# Patient Record
Sex: Male | Born: 1990
Health system: Southern US, Community
[De-identification: ages and names within clinical notes are randomized; demographics above are authoritative.]

## PROBLEM LIST (undated history)

## (undated) DIAGNOSIS — J45909 Unspecified asthma, uncomplicated: Secondary | ICD-10-CM

---
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
## 2008-02-24 ENCOUNTER — Emergency Department (HOSPITAL_COMMUNITY): Admission: EM | Admit: 2008-02-24 | Discharge: 2008-02-24 | Payer: Self-pay | Admitting: *Deleted

## 2009-08-14 ENCOUNTER — Emergency Department (HOSPITAL_COMMUNITY): Admission: EM | Admit: 2009-08-14 | Discharge: 2009-08-14 | Payer: Self-pay | Admitting: Emergency Medicine

## 2010-03-16 ENCOUNTER — Emergency Department (HOSPITAL_COMMUNITY)
Admission: EM | Admit: 2010-03-16 | Discharge: 2010-03-16 | Payer: Self-pay | Source: Home / Self Care | Admitting: Emergency Medicine

## 2010-08-02 LAB — POCT I-STAT, CHEM 8
BUN: 12 mg/dL (ref 6–23)
Creatinine, Ser: 0.9 mg/dL (ref 0.4–1.5)
Hemoglobin: 16.3 g/dL (ref 13.0–17.0)
Potassium: 3.5 mEq/L (ref 3.5–5.1)

## 2010-08-02 LAB — DIFFERENTIAL
Basophils Absolute: 0 10*3/uL (ref 0.0–0.1)
Lymphs Abs: 2.2 10*3/uL (ref 0.7–4.0)
Monocytes Relative: 6 % (ref 3–12)
Neutro Abs: 4.1 10*3/uL (ref 1.7–7.7)

## 2010-08-02 LAB — CBC
HCT: 45 % (ref 39.0–52.0)
MCV: 90.2 fL (ref 78.0–100.0)
RBC: 4.99 MIL/uL (ref 4.22–5.81)
RDW: 14 % (ref 11.5–15.5)
WBC: 7.1 10*3/uL (ref 4.0–10.5)

## 2010-08-02 LAB — POCT CARDIAC MARKERS
CKMB, poc: <1 ng/mL — ABNORMAL LOW (ref 1.0–8.0)
Troponin i, poc: <0.05 ng/mL (ref 0.00–0.09)

## 2015-12-04 ENCOUNTER — Ambulatory Visit (HOSPITAL_COMMUNITY)
Admission: EM | Admit: 2015-12-04 | Discharge: 2015-12-04 | Disposition: A | Discharge status: Home / Self Care | Payer: Self-pay | Attending: Family Medicine | Admitting: Family Medicine

## 2015-12-04 ENCOUNTER — Encounter (HOSPITAL_COMMUNITY): Payer: Self-pay | Admitting: Family Medicine

## 2015-12-04 DIAGNOSIS — J029 Acute pharyngitis, unspecified: Secondary | ICD-10-CM | POA: Insufficient documentation

## 2015-12-04 DIAGNOSIS — G43909 Migraine, unspecified, not intractable, without status migrainosus: Secondary | ICD-10-CM | POA: Insufficient documentation

## 2015-12-04 HISTORY — DX: Unspecified asthma, uncomplicated: J45.909

## 2015-12-04 LAB — POCT RAPID STREP A: STREPTOCOCCUS, GROUP A SCREEN (DIRECT): NEGATIVE

## 2015-12-04 MED ORDER — ONDANSETRON 4 MG PO TBDP
8.0000 mg | ORAL_TABLET | Freq: Once | ORAL | Status: AC
  Administered 2015-12-04: 8 mg via ORAL

## 2015-12-04 MED ORDER — DEXAMETHASONE SODIUM PHOSPHATE 10 MG/ML IJ SOLN
INTRAMUSCULAR | Status: AC
  Filled 2015-12-04: qty 1

## 2015-12-04 MED ORDER — DEXAMETHASONE SODIUM PHOSPHATE 10 MG/ML IJ SOLN
10.0000 mg | Freq: Once | INTRAMUSCULAR | Status: AC
  Administered 2015-12-04: 10 mg via INTRAVENOUS

## 2015-12-04 MED ORDER — KETOROLAC TROMETHAMINE 60 MG/2ML IM SOLN
60.0000 mg | Freq: Once | INTRAMUSCULAR | Status: AC
  Administered 2015-12-04: 60 mg via INTRAMUSCULAR

## 2015-12-04 MED ORDER — ONDANSETRON 4 MG PO TBDP
ORAL_TABLET | ORAL | Status: AC
Start: 2015-12-04 — End: 2015-12-04
  Filled 2015-12-04: qty 2

## 2015-12-04 MED ORDER — KETOROLAC TROMETHAMINE 60 MG/2ML IM SOLN
INTRAMUSCULAR | Status: AC
  Filled 2015-12-04: qty 2

## 2015-12-04 NOTE — ED Provider Notes (Signed)
CSN: [id number]     Arrival date & time 12/04/15  1640 History   First MD Initiated Contact with Patient 12/04/15 1712     Chief Complaint  Patient presents with  . Sore Throat  . Fever   (Consider location/radiation/quality/duration/timing/severity/associated sxs/prior Treatment) Patient presents today for sore throat x 3 days accompany by running nose, fever, congestion and headache. His headache has worsen since the initial onset and has now progressed to migraine with photosensitivity and nausea. Patient describes his sore throat as constant. He have not tried anything at home for his sore throat. He rate his headache at 8/10. He has tried ibuprofen, tylenol and Excedrin for his headache without relief.       Sore Throat  Associated symptoms include headaches. Pertinent negatives include no chest pain, no abdominal pain and no shortness of breath.  Fever  Associated symptoms: congestion, headaches, nausea, rhinorrhea and sore throat   Associated symptoms: no chest pain, no cough and no ear pain     Past Medical History:  Diagnosis Date  . Asthma    History reviewed. No pertinent surgical history. History reviewed. No pertinent family history. Social History  Substance Use Topics  . Smoking status: Never Smoker  . Smokeless tobacco: Never Used  . Alcohol use Not on file    Review of Systems  Constitutional: Positive for fever.  HENT: Positive for congestion, rhinorrhea and sore throat. Negative for ear pain, postnasal drip and sneezing.   Eyes: Positive for photophobia.  Respiratory: Negative for cough and shortness of breath.   Cardiovascular: Negative for chest pain and palpitations.  Gastrointestinal: Positive for nausea. Negative for abdominal pain.  Neurological: Positive for headaches. Negative for dizziness and weakness.    Allergies  Penicillins  Home Medications   Prior to Admission medications   Not on File   Meds Ordered and Administered this Visit    Medications  ondansetron (ZOFRAN-ODT) disintegrating tablet 8 mg (8 mg Oral Given 12/04/15 1748)  ketorolac (TORADOL) injection 60 mg (60 mg Intramuscular Given 12/04/15 1748)  dexamethasone (DECADRON) injection 10 mg (10 mg Intravenous Given 12/04/15 1748)    BP 135/76   Pulse 90   Temp 98.4 F (36.9 C) (Oral)   Resp 16   SpO2 100%  No data found.   Physical Exam  Constitutional: He is oriented to person, place, and time. He appears well-developed and well-nourished.  HENT:  Head: Normocephalic.  Mouth/Throat: Oropharynx is clear and moist. No oropharyngeal exudate.  Pharyngeal erythema present  Eyes: Conjunctivae are normal. Pupils are equal, round, and reactive to light.  Neck: Neck supple.  Cardiovascular: Normal rate, regular rhythm and normal heart sounds.   Pulmonary/Chest: Effort normal and breath sounds normal. No respiratory distress.  Abdominal: Soft. Bowel sounds are normal. He exhibits no mass. There is no tenderness.  Neurological: He is alert and oriented to person, place, and time.    Urgent Care Course   Clinical Course  Comment By Time  Patient reports the headache has improved after the medications. Rates his headache 5/10 currently, it was 8/10 on arrival   Lucia Estelle, NP 07/23 1802    Procedures (including critical care time)  Labs Review Labs Reviewed  CULTURE, GROUP A STREP [facility])  POCT RAPID STREP A    Imaging Review No results found.   Visual Acuity Review  Right Eye Distance:   Left Eye Distance:   Bilateral Distance:    Right Eye Near:   Left Eye Near:  Bilateral Near:         MDM   1. Viral pharyngitis   2.      Migraine   Patient presents today for migraine and sore throat. Rapid strep was negative; culture send to lab. Patient instructed to use salt water gargle, throat lozenges, and/or honey for symptom relief. May also use chloraseptic lozenges for sore throat if non-medicated options are not helpful.   His  migraine was treated in office with toradol, zofran, and decadron. Patient reports feeling a lot better after treatment. At discharge, his headache was at 4/10, and his photosensitivity also improved.   Patient instructed that he may continue to take OTC med for his migraine if the headache returns. Instructed to follow up with his PCP if he worsens.    Lucia Estelle, NP 12/04/15 [phone]

## 2015-12-07 LAB — CULTURE, GROUP A STREP (THRC)

## 2016-03-14 ENCOUNTER — Ambulatory Visit (INDEPENDENT_AMBULATORY_CARE_PROVIDER_SITE_OTHER)
Admission: RE | Admit: 2016-03-14 | Discharge: 2016-03-14 | Disposition: A | Discharge status: Home / Self Care | Payer: BLUE CROSS/BLUE SHIELD | Source: Ambulatory Visit | Attending: Nurse Practitioner | Admitting: Nurse Practitioner

## 2016-03-14 ENCOUNTER — Ambulatory Visit (INDEPENDENT_AMBULATORY_CARE_PROVIDER_SITE_OTHER): Payer: BLUE CROSS/BLUE SHIELD | Admitting: Nurse Practitioner

## 2016-03-14 ENCOUNTER — Encounter: Payer: Self-pay | Admitting: Nurse Practitioner

## 2016-03-14 VITALS — BP 128/78 | HR 72 | Temp 98.2°F | Ht 70.0 in | Wt 180.0 lb

## 2016-03-14 DIAGNOSIS — Z0001 Encounter for general adult medical examination with abnormal findings: Secondary | ICD-10-CM

## 2016-03-14 DIAGNOSIS — G8929 Other chronic pain: Secondary | ICD-10-CM

## 2016-03-14 DIAGNOSIS — M545 Low back pain, unspecified: Secondary | ICD-10-CM | POA: Insufficient documentation

## 2016-03-14 MED ORDER — PREDNISONE 10 MG (21) PO TBPK: 10.0000 mg | ORAL_TABLET | Freq: Every day | ORAL | 0 refills | Status: DC

## 2016-03-14 MED ORDER — CYCLOBENZAPRINE HCL 5 MG PO TABS: 5.0000 mg | ORAL_TABLET | Freq: Three times a day (TID) | ORAL | 0 refills | Status: DC | PRN

## 2016-03-14 MED ORDER — IBUPROFEN-FAMOTIDINE 800-26.6 MG PO TABS
1.0000 | ORAL_TABLET | Freq: Three times a day (TID) | ORAL | 2 refills | Status: DC | PRN
Start: 2016-03-14 — End: 2016-08-10

## 2016-03-14 NOTE — Progress Notes (Signed)
Pre visit review using our clinic review tool, if applicable. No additional management support is needed unless otherwise documented below in the visit note. 

## 2016-03-14 NOTE — Assessment & Plan Note (Signed)
Lumbar x-ray indicates L4-5 disc space narrowing. Prescribed oral prednisone x 7days, naproxen and flexeril prn. Consider referral to sport medicine if no improvement.

## 2016-03-14 NOTE — Progress Notes (Signed)
Subjective:    Patient ID: Christopher Sosa, male    DOB: [birth year], 25 y.o.   MRN: [id number]  Patient presents today for complete physical or establish care (new patient) and lower back pain  HPI  Immunizations: (TDAP, Hep C screen, Pneumovax, Influenza, zoster)  Health Maintenance  Topic Date Due  . Flu Shot  08/11/2016*  . Tetanus Vaccine  03/14/2017*  . HIV Screening  03/14/2017*  *Topic was postponed. The date shown is not the original due date.   Diet:none Weight:  Wt Readings from Last 3 Encounters:  03/14/16 180 lb (81.6 kg)   Exercise:none Fall Risk: Fall Risk  03/14/2016  Falls in the past year? No   Home Safety:home with wife Depression/Suicide: Depression screen [facility] 2/9 03/14/2016  Decreased Interest 0  Down, Depressed, Hopeless 0  PHQ - 2 Score 0   No flowsheet data found. Vision:needed, patient will schedule Dental:needed, patient will schedule Advanced Directive: Advanced Directives 03/14/2016  Does patient have an advance directive? No  Would patient like information on creating an advanced directive? No - patient declined information   Sexual History (birth control, marital status, STD):married, sexually active  Medications and allergies reviewed with patient and updated if appropriate.  Patient Active Problem List   Diagnosis Date Noted  . Chronic bilateral low back pain without sciatica 03/14/2016    No current outpatient prescriptions on file prior to visit.   No current facility-administered medications on file prior to visit.     Past Medical History:  Diagnosis Date  . Asthma    chilhood and teenage years, triggered by temperature and exercise.    History reviewed. No pertinent surgical history.  Social History   Social History  . Marital status: Single    Spouse name: N/A  . Number of children: N/A  . Years of education: N/A   Social History Main Topics  . Smoking status: Never Smoker  . Smokeless tobacco: Never Used   . Alcohol use No  . Drug use: No  . Sexual activity: Yes    Birth control/ protection: None   Other Topics Concern  . None   Social History Narrative  . None    Family History  Problem Relation Age of Onset  . Diabetes Mother   . Diabetes Father   . Diabetes Paternal Grandfather   . Hyperlipidemia Paternal Grandfather         Review of Systems  Constitutional: Negative for fever, malaise/fatigue and weight loss.  HENT: Negative for congestion and sore throat.   Eyes:       Negative for visual changes  Respiratory: Negative for cough and shortness of breath.   Cardiovascular: Negative for chest pain, palpitations and leg swelling.  Gastrointestinal: Negative for blood in stool, constipation, diarrhea and heartburn.  Genitourinary: Negative for dysuria, frequency and urgency.  Musculoskeletal: Positive for back pain. Negative for falls, joint pain and myalgias.  Skin: Negative for rash.  Neurological: Negative for dizziness, sensory change and headaches.  Endo/Heme/Allergies: Does not bruise/bleed easily.  Psychiatric/Behavioral: Negative for depression, substance abuse and suicidal ideas. The patient is not nervous/anxious.     Objective:   Vitals:   03/14/16 0914  BP: 128/78  Pulse: 72  Temp: 98.2 F (36.8 C)    Body mass index is 25.83 kg/m.   Physical Examination:  Physical Exam  Constitutional: He is oriented to person, place, and time and well-developed, well-nourished, and in no distress. No distress.  HENT:  Right Ear: External  ear normal.  Left Ear: External ear normal.  Nose: Nose normal.  Mouth/Throat: Oropharynx is clear and moist. No oropharyngeal exudate.  Eyes: Conjunctivae and EOM are normal. Pupils are equal, round, and reactive to light. No scleral icterus.  Neck: Normal range of motion. Neck supple. No thyromegaly present.  Cardiovascular: Normal rate, normal heart sounds and intact distal pulses.   Pulmonary/Chest: Effort normal and  breath sounds normal. He exhibits no tenderness.  Abdominal: Soft. Bowel sounds are normal. He exhibits no distension. There is no tenderness.  Genitourinary:  Genitourinary Comments: Patient declined  Musculoskeletal: Normal range of motion. He exhibits no edema or tenderness.  Negative straight leg raise.  Lymphadenopathy:    He has no cervical adenopathy.  Neurological: He is alert and oriented to person, place, and time. He has normal reflexes. No cranial nerve deficit. Gait normal.  Skin: Skin is warm and dry.  Psychiatric: Affect and judgment normal.  Vitals reviewed.   ASSESSMENT and PLAN:  Ezana was seen today for back pain.  Diagnoses and all orders for this visit:  Encounter for preventative adult health care exam with abnormal findings -     CBC w/Diff; Future -     Comp Met (CMET); Future -     TSH; Future -     Lipid panel; Future  Chronic bilateral low back pain without sciatica -     Urinalysis, Routine w reflex microscopic; Future -     DG Lumbar Spine 2-3 Views; Future -     cyclobenzaprine (FLEXERIL) 5 MG tablet; Take 1 tablet (5 mg total) by mouth 3 (three) times daily as needed for muscle spasms. -     Ibuprofen-Famotidine (DUEXIS) 800-26.6 MG TABS; Take 1 tablet by mouth every 8 (eight) hours as needed. With food -     Discontinue: predniSONE (STERAPRED UNI-PAK 21 TAB) 10 MG (21) TBPK tablet; Take 1 tablet (10 mg total) by mouth daily. -     predniSONE (STERAPRED UNI-PAK 21 TAB) 10 MG (21) TBPK tablet; Take 1 tablet (10 mg total) by mouth daily.    Chronic bilateral low back pain without sciatica Lumbar x-ray indicates L4-5 disc space narrowing. Prescribed oral prednisone x 7days, naproxen and flexeril prn. Consider referral to sport medicine if no improvement.     Follow up: Return in about 6 months (around 09/11/2016) for back pain.  Wilfred Lacy, NP

## 2016-03-14 NOTE — Patient Instructions (Signed)
Do exercise once a day. Maintain proper body mechanics at all times.  Back Exercises If you have pain in your back, do these exercises 2-3 times each day or as told by your doctor. When the pain goes away, do the exercises once each day, but repeat the steps more times for each exercise (do more repetitions). If you do not have pain in your back, do these exercises once each day or as told by your doctor. EXERCISES Single Knee to Chest Do these steps 3-5 times in a row for each leg: 1. Lie on your back on a firm bed or the floor with your legs stretched out. 2. Bring one knee to your chest. 3. Hold your knee to your chest by grabbing your knee or thigh. 4. Pull on your knee until you feel a gentle stretch in your lower back. 5. Keep doing the stretch for 10-30 seconds. 6. Slowly let go of your leg and straighten it. Pelvic Tilt Do these steps 5-10 times in a row: 1. Lie on your back on a firm bed or the floor with your legs stretched out. 2. Bend your knees so they point up to the ceiling. Your feet should be flat on the floor. 3. Tighten your lower belly (abdomen) muscles to press your lower back against the floor. This will make your tailbone point up to the ceiling instead of pointing down to your feet or the floor. 4. Stay in this position for 5-10 seconds while you gently tighten your muscles and breathe evenly. Cat-Cow Do these steps until your lower back bends more easily: 1. Get on your hands and knees on a firm surface. Keep your hands under your shoulders, and keep your knees under your hips. You may put padding under your knees. 2. Let your head hang down, and make your tailbone point down to the floor so your lower back is round like the back of a cat. 3. Stay in this position for 5 seconds. 4. Slowly lift your head and make your tailbone point up to the ceiling so your back hangs low (sags) like the back of a cow. 5. Stay in this position for 5 seconds. Press-Ups Do these  steps 5-10 times in a row: 1. Lie on your belly (face-down) on the floor. 2. Place your hands near your head, about shoulder-width apart. 3. While you keep your back relaxed and keep your hips on the floor, slowly straighten your arms to raise the top half of your body and lift your shoulders. Do not use your back muscles. To make yourself more comfortable, you may change where you place your hands. 4. Stay in this position for 5 seconds. 5. Slowly return to lying flat on the floor. Bridges Do these steps 10 times in a row: 1. Lie on your back on a firm surface. 2. Bend your knees so they point up to the ceiling. Your feet should be flat on the floor. 3. Tighten your butt muscles and lift your butt off of the floor until your waist is almost as high as your knees. If you do not feel the muscles working in your butt and the back of your thighs, slide your feet 1-2 inches farther away from your butt. 4. Stay in this position for 3-5 seconds. 5. Slowly lower your butt to the floor, and let your butt muscles relax. If this exercise is too easy, try doing it with your arms crossed over your chest. Belly Crunches Do these steps 5-10  times in a row: 1. Lie on your back on a firm bed or the floor with your legs stretched out. 2. Bend your knees so they point up to the ceiling. Your feet should be flat on the floor. 3. Cross your arms over your chest. 4. Tip your chin a little bit toward your chest but do not bend your neck. 5. Tighten your belly muscles and slowly raise your chest just enough to lift your shoulder blades a tiny bit off of the floor. 6. Slowly lower your chest and your head to the floor. Back Lifts Do these steps 5-10 times in a row: 1. Lie on your belly (face-down) with your arms at your sides, and rest your forehead on the floor. 2. Tighten the muscles in your legs and your butt. 3. Slowly lift your chest off of the floor while you keep your hips on the floor. Keep the back of  your head in line with the curve in your back. Look at the floor while you do this. 4. Stay in this position for 3-5 seconds. 5. Slowly lower your chest and your face to the floor. GET HELP IF:  Your back pain gets a lot worse when you do an exercise.  Your back pain does not lessen 2 hours after you exercise. If you have any of these problems, stop doing the exercises. Do not do them again unless your doctor says it is okay. GET HELP RIGHT AWAY IF:  You have sudden, very bad back pain. If this happens, stop doing the exercises. Do not do them again unless your doctor says it is okay.   This information is not intended to replace advice given to you by your health care provider. Make sure you discuss any questions you have with your health care provider.   Document Released: 06/02/2010 Document Revised: 01/19/2015 Document Reviewed: 06/24/2014 Elsevier Interactive Patient Education [employer]2016 [employer] Inc.

## 2016-08-10 ENCOUNTER — Encounter (HOSPITAL_BASED_OUTPATIENT_CLINIC_OR_DEPARTMENT_OTHER): Payer: Self-pay

## 2016-08-10 ENCOUNTER — Emergency Department (HOSPITAL_BASED_OUTPATIENT_CLINIC_OR_DEPARTMENT_OTHER)
Admission: EM | Admit: 2016-08-10 | Discharge: 2016-08-10 | Disposition: A | Discharge status: Home / Self Care | Payer: BLUE CROSS/BLUE SHIELD | Attending: Emergency Medicine | Admitting: Emergency Medicine

## 2016-08-10 DIAGNOSIS — M25511 Pain in right shoulder: Secondary | ICD-10-CM | POA: Diagnosis present

## 2016-08-10 DIAGNOSIS — J45909 Unspecified asthma, uncomplicated: Secondary | ICD-10-CM | POA: Diagnosis not present

## 2016-08-10 MED ORDER — NAPROXEN 250 MG PO TABS
500.0000 mg | ORAL_TABLET | Freq: Once | ORAL | Status: AC
  Administered 2016-08-10: 500 mg via ORAL
  Filled 2016-08-10: qty 2

## 2016-08-10 MED ORDER — HYDROCODONE-ACETAMINOPHEN 5-325 MG PO TABS: 1.0000 | ORAL_TABLET | Freq: Four times a day (QID) | ORAL | 0 refills | Status: DC | PRN

## 2016-08-10 MED ORDER — NAPROXEN 500 MG PO TABS: 500.0000 mg | ORAL_TABLET | Freq: Two times a day (BID) | ORAL | 0 refills | Status: DC | PRN

## 2016-08-10 NOTE — ED Provider Notes (Signed)
[facility] Provider Note: Christopher Dell, MD, FACEP  CSN: [id number] MRN: [id number] ARRIVAL: 08/10/16 at 2101 ROOM: [facility]1/[facility]  By signing my name below, I, Christopher Sosa, attest that this documentation has been prepared under the direction and in the presence of Paula Libra, MD. Electronically Signed: Doreatha Sosa, ED Scribe. 08/10/16. 11:35 PM.    CHIEF COMPLAINT  Shoulder Pain   HISTORY OF PRESENT ILLNESS  Christopher Sosa is a 26 y.o. male who presents to the Emergency Department complaining of moderate right shoulder pain with radiation to the neck anterior upper chest that and posterior upper back began 2 days ago. Pt states he felt a "pulling" sensation in the shoulder after stretching 2 days ago, which was followed by tightness that has been gradually worsening since. Pt states his pain is worsened with arm movement and neck rotation. He denies pain radiation down his arm.    Past Medical History:  Diagnosis Date  . Asthma    chilhood and teenage years, triggered by temperature and exercise.    History reviewed. No pertinent surgical history.  Family History  Problem Relation Age of Onset  . Diabetes Mother   . Diabetes Father   . Diabetes Paternal Grandfather   . Hyperlipidemia Paternal Grandfather     Social History  Substance Use Topics  . Smoking status: Never Smoker  . Smokeless tobacco: Never Used  . Alcohol use No    Prior to Admission medications   Medication Sig Start Date End Date Taking? Authorizing Provider  HYDROcodone-acetaminophen (NORCO) 5-325 MG tablet Take 1-2 tablets by mouth every 6 (six) hours as needed. 08/10/16   Emin Foree, MD  naproxen (NAPROSYN) 500 MG tablet Take 1 tablet (500 mg total) by mouth 2 (two) times daily as needed (for pain). 08/10/16   Paula Libra, MD    Allergies Penicillins   REVIEW OF SYSTEMS  Negative except as noted here or in the History of Present Illness.   PHYSICAL EXAMINATION  Initial Vital  Signs Blood pressure 126/82, pulse 68, temperature 98.3 F (36.8 C), temperature source Oral, resp. rate 18, height  (1.778 m), weight 170 lb (77.1 kg), SpO2 100 %.   Examination General: Well-developed, well-nourished male in no acute distress; appearance consistent with age of record HENT: normocephalic; atraumatic Eyes: pupils equal, round and reactive to light; extraocular muscles intact Neck: supple; rotation of the neck reproduces pain Heart: regular rate and rhythm Lungs: clear to auscultation bilaterally Abdomen: soft; nondistended; nontender; no masses or hepatosplenomegaly; bowel sounds present Extremities: No deformity; full range of motion; FROM of the right shoulder; some reproduction of pain with movement of the right shoulder; right trapezius tenderness  Neurologic: Awake, alert and oriented; motor function intact in all extremities and symmetric; no facial droop Skin: Warm and dry Psychiatric: Normal mood and affect   RESULTS  Summary of this visit's results, reviewed by myself:   EKG Interpretation  Date/Time:    Ventricular Rate:    PR Interval:    QRS Duration:   QT Interval:    QTC Calculation:   R Axis:     Text Interpretation:        Laboratory Studies: No results found for this or any previous visit (from the past 24 hour(s)). Imaging Studies: No results found.  ED COURSE  Nursing notes and initial vitals signs, including pulse oximetry, reviewed.  Vitals:   08/10/16 2106  BP: 126/82  Pulse: 68  Resp: 18  Temp: 98.3 F (36.8  C)  TempSrc: Oral  SpO2: 100%  Weight: 170 lb (77.1 kg)  Height:  (1.778 m)   11:42 PM The patient's pain has elements suggestive of cervical radiculopathy as well as trapezius muscle strain. We will treat symptomatically and refer to sports medicine for follow-up if he is not improving.  PROCEDURES    ED DIAGNOSES     ICD-9-CM ICD-10-CM   1. Acute pain of right shoulder 719.41 M25.511      I  personally performed the services described in this documentation, which was scribed in my presence. The recorded information has been reviewed and is accurate.    Paula Libra, MD 08/10/16 [phone]

## 2016-08-10 NOTE — ED Triage Notes (Signed)
c/o pain to right shoulder, upper back and chest-started after stretching 2 days ago-pain is worse with movement-states he had difficulty putting shirt on today due to pain -denies injury-NAD-steady gait

## 2016-09-11 ENCOUNTER — Ambulatory Visit: Payer: BLUE CROSS/BLUE SHIELD | Admitting: Nurse Practitioner

## 2016-11-06 ENCOUNTER — Encounter: Payer: Self-pay | Admitting: Nurse Practitioner

## 2016-11-09 ENCOUNTER — Encounter: Payer: Self-pay | Admitting: Nurse Practitioner

## 2016-11-09 ENCOUNTER — Ambulatory Visit: Payer: Self-pay | Admitting: Nurse Practitioner

## 2017-06-26 ENCOUNTER — Encounter: Payer: Self-pay | Admitting: Nurse Practitioner

## 2017-08-12 IMAGING — DX DG LUMBAR SPINE 2-3V
3 series · 3 of 3 positions shown · non-contrast
Comparison: None in PACs

CLINICAL DATA: Chronic low back pain without sciatic symptoms.
Symptoms are increasing. No known injury.

EXAM:
LUMBAR SPINE - 2-3 VIEW

[l-spine ap]
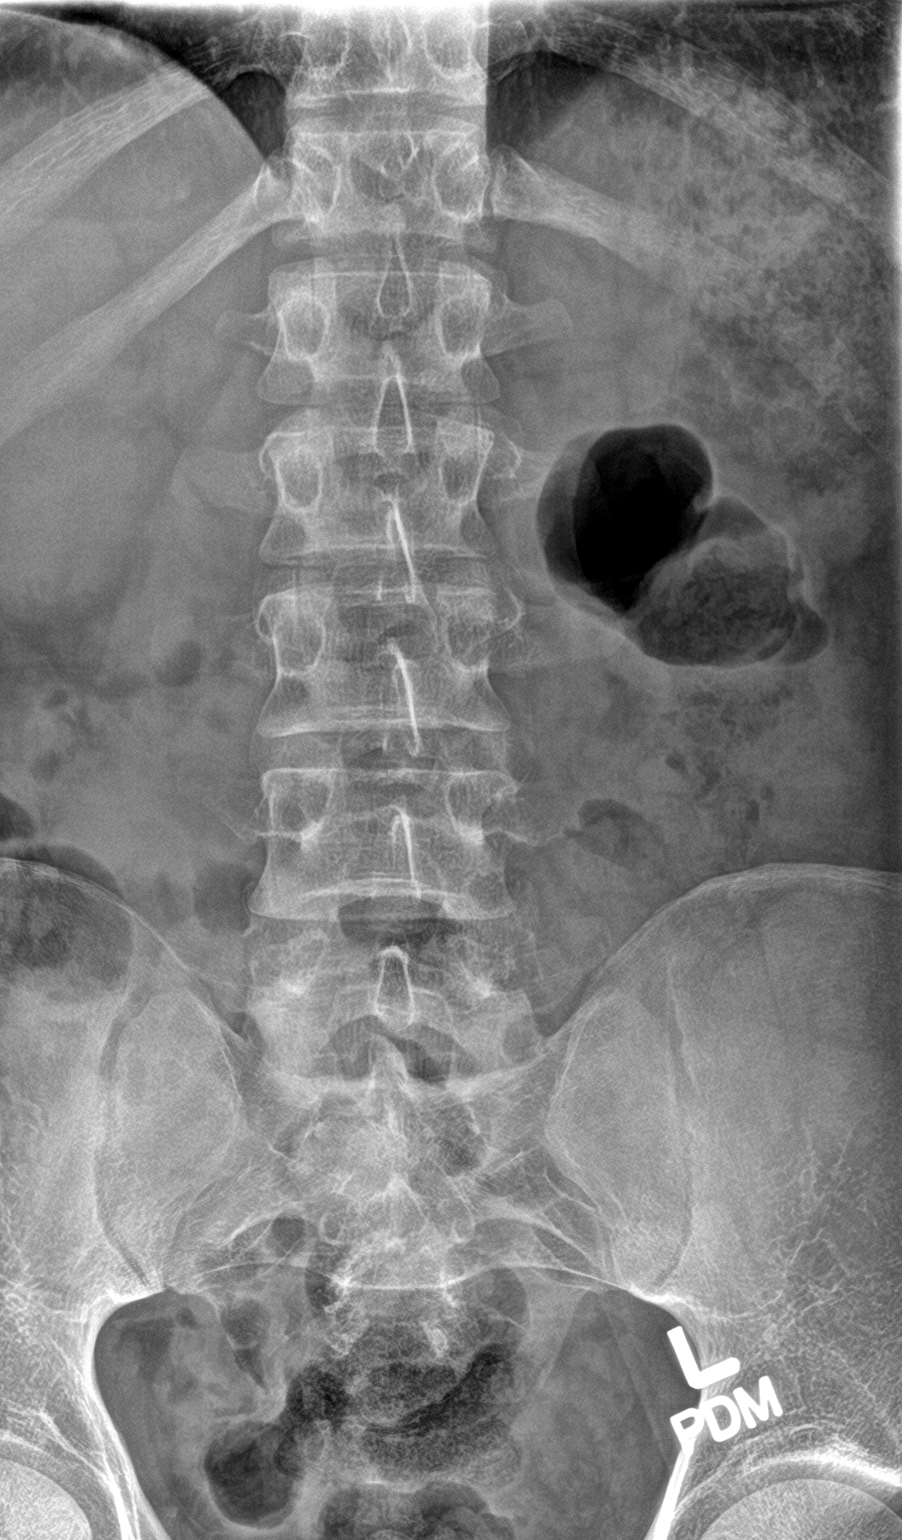

[l-spine lat]
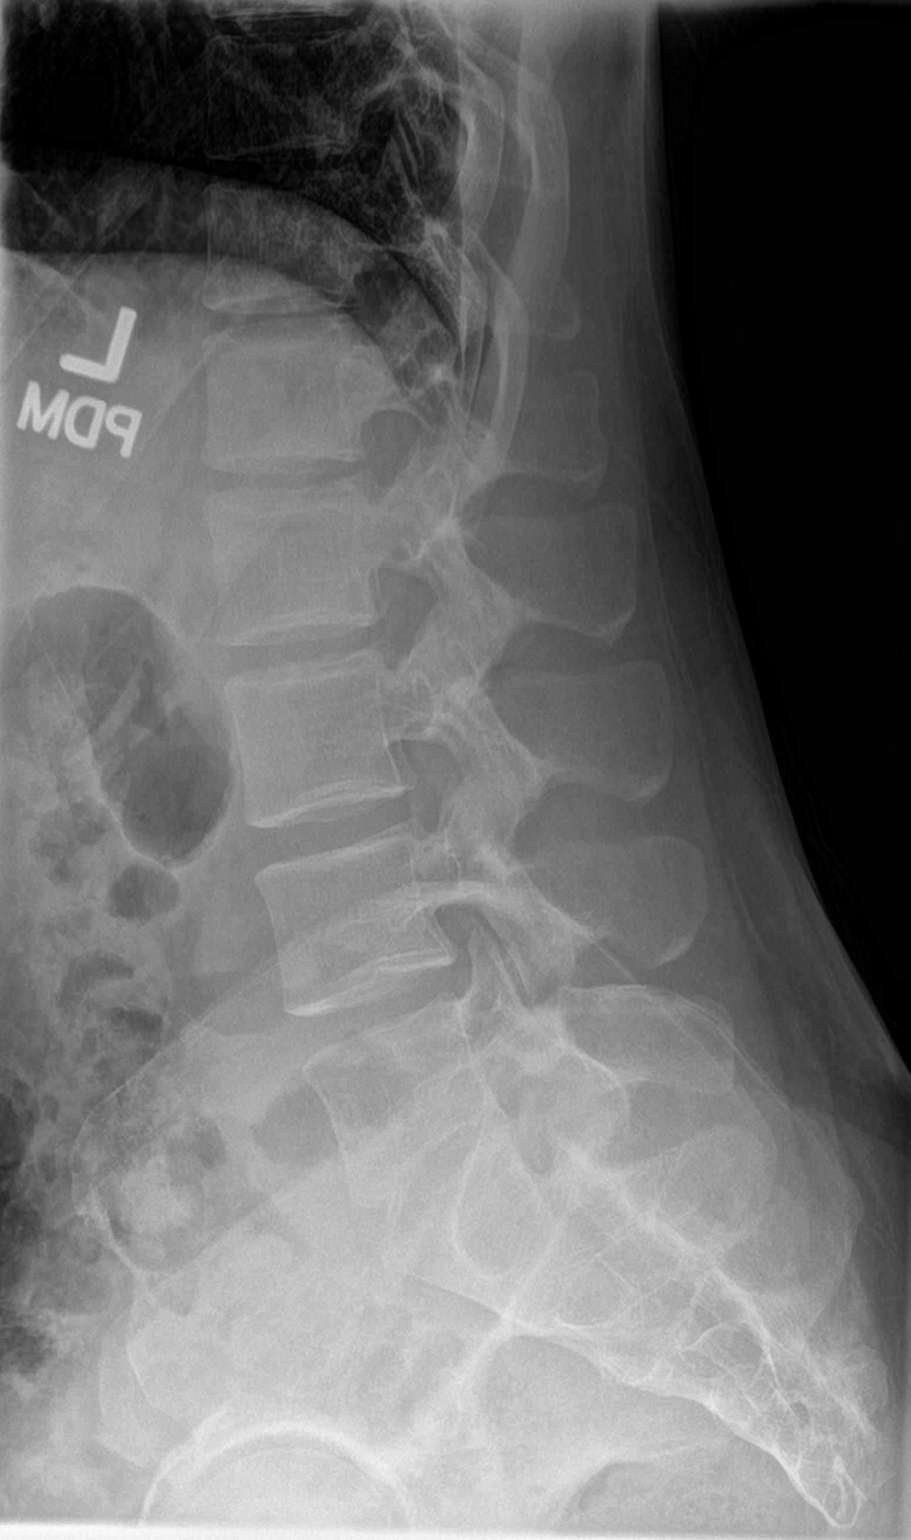

[l-spine spot]
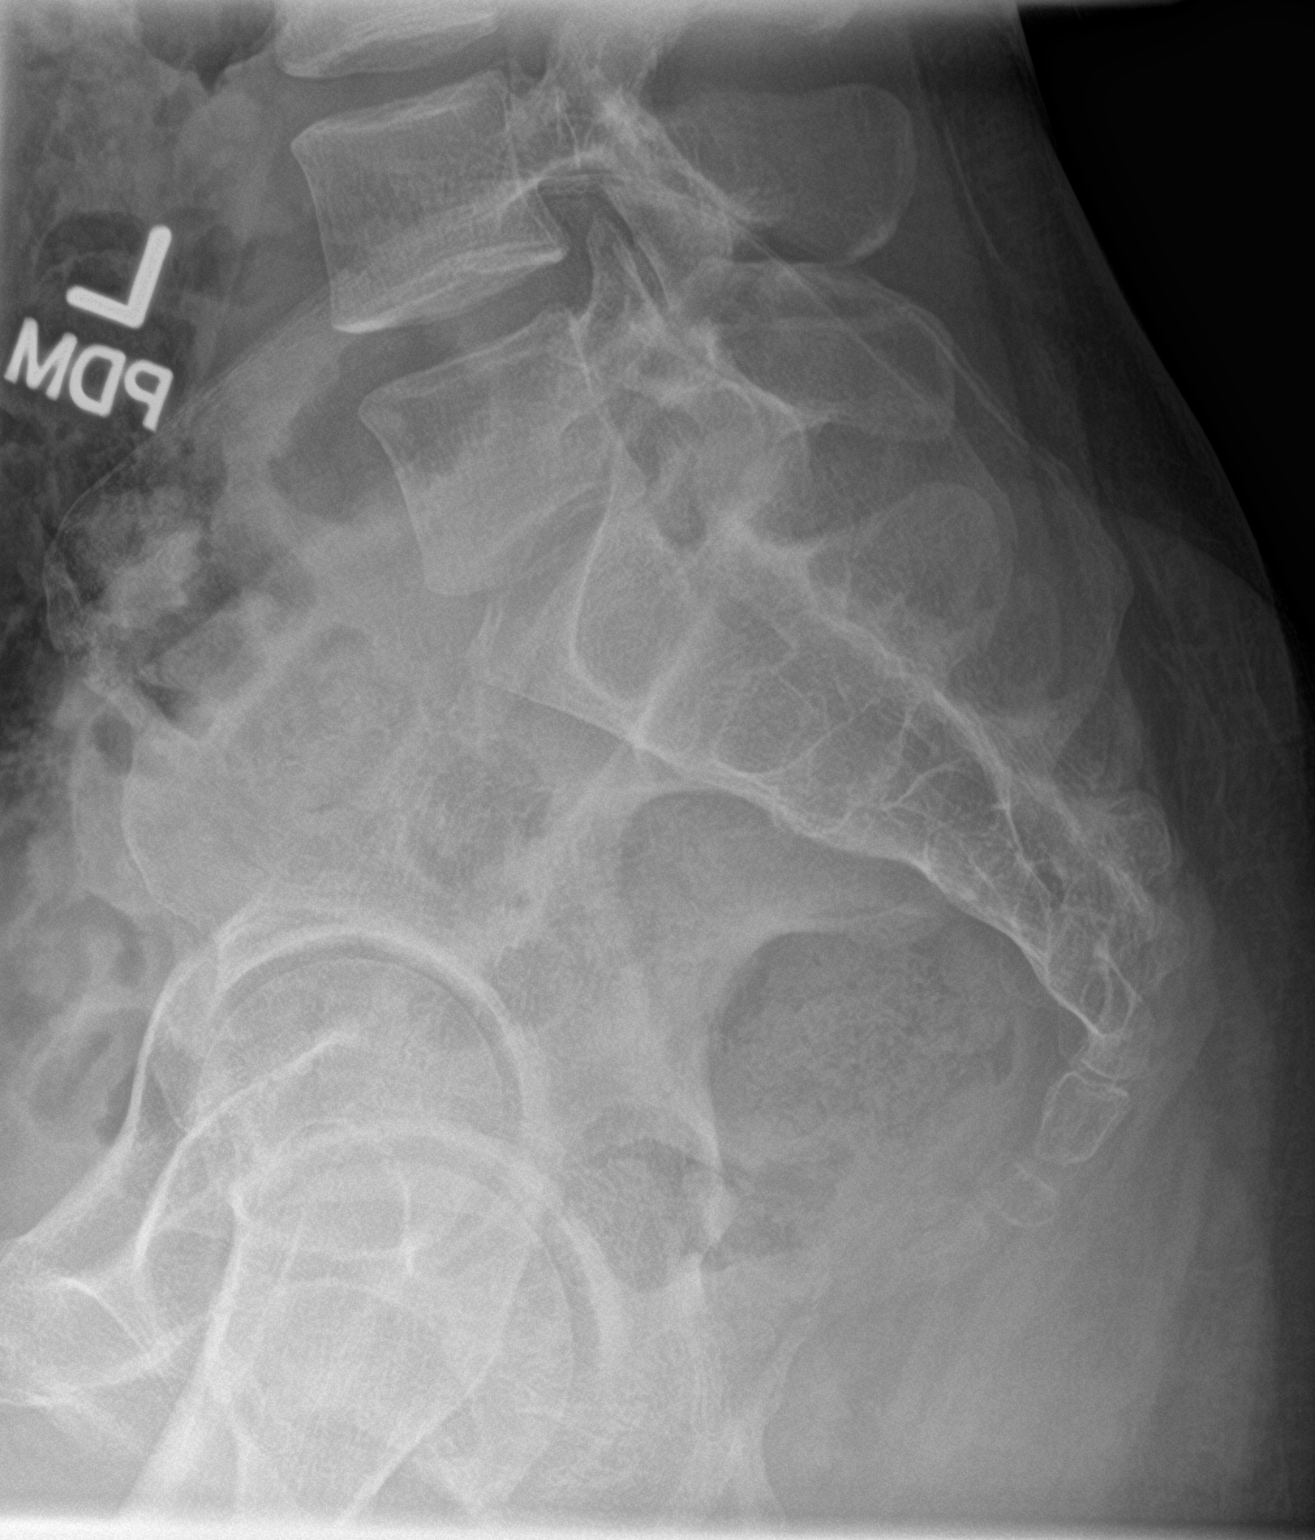

[3 of 3 positions shown; findings below may reference images not displayed]

FINDINGS: The lumbar vertebral bodies are preserved in height. There is
minimal disc space narrowing at L4-5. There is no spondylolisthesis.
There is no significant facet joint hypertrophy. The pedicles and
transverse processes are intact. The observed portions of the sacrum
are normal. The bowel gas pattern is unremarkable.
IMPRESSION: Very mild narrowing of the L4-5 disc space. No compression fracture
nor other acute bony abnormality.

## 2017-12-18 ENCOUNTER — Telehealth: Payer: Self-pay | Admitting: Nurse Practitioner

## 2017-12-18 NOTE — Telephone Encounter (Signed)
I can not prescribe without records and/or lab results. Also me prescribing medication vs the [facility] provider will not change the insurance coverage. If he is unable to get records to me, he needs to make an appt for repeat in testing.

## 2017-12-18 NOTE — Telephone Encounter (Signed)
Copied from CRM [phone]#[id number]. Topic: Inquiry >> Dec 18, 2017  8:34 AM Debroah LoopLander, Lumin L wrote: Reason for CRM: [facility] and insurance would not cover azithromycin and ceftriaxone so patient wants to know if his PCP [facility] can prescribe it?  >> Dec 18, 2017  8:43 AM Eulene Pekar, LPN wrote: Please advise. Last ov 2017.

## 2017-12-18 NOTE — Telephone Encounter (Signed)
Spoke with the pt, advise him of message below. He said he will get the record to us and or if not then [facility] will [facility] [place]

## 2017-12-24 ENCOUNTER — Encounter: Payer: Self-pay | Admitting: Nurse Practitioner

## 2017-12-25 ENCOUNTER — Emergency Department (HOSPITAL_BASED_OUTPATIENT_CLINIC_OR_DEPARTMENT_OTHER)
Admission: EM | Admit: 2017-12-25 | Discharge: 2017-12-25 | Disposition: A | Discharge status: Home / Self Care | Payer: Managed Care, Other (non HMO) | Attending: Emergency Medicine | Admitting: Emergency Medicine

## 2017-12-25 ENCOUNTER — Other Ambulatory Visit: Payer: Self-pay

## 2017-12-25 ENCOUNTER — Encounter (HOSPITAL_BASED_OUTPATIENT_CLINIC_OR_DEPARTMENT_OTHER): Payer: Self-pay | Admitting: Emergency Medicine

## 2017-12-25 DIAGNOSIS — J45909 Unspecified asthma, uncomplicated: Secondary | ICD-10-CM | POA: Diagnosis not present

## 2017-12-25 DIAGNOSIS — H9203 Otalgia, bilateral: Secondary | ICD-10-CM | POA: Diagnosis not present

## 2017-12-25 DIAGNOSIS — Z202 Contact with and (suspected) exposure to infections with a predominantly sexual mode of transmission: Secondary | ICD-10-CM

## 2017-12-25 DIAGNOSIS — G4489 Other headache syndrome: Secondary | ICD-10-CM | POA: Diagnosis not present

## 2017-12-25 DIAGNOSIS — R51 Headache: Secondary | ICD-10-CM | POA: Diagnosis present

## 2017-12-25 LAB — URINALYSIS, ROUTINE W REFLEX MICROSCOPIC
BILIRUBIN URINE: NEGATIVE
GLUCOSE, UA: NEGATIVE mg/dL
HGB URINE DIPSTICK: NEGATIVE
KETONES UR: NEGATIVE mg/dL
Nitrite: NEGATIVE
PROTEIN: NEGATIVE mg/dL
Specific Gravity, Urine: <1.005 — ABNORMAL LOW (ref 1.005–1.030)
pH: 6.5 (ref 5.0–8.0)

## 2017-12-25 LAB — URINALYSIS, MICROSCOPIC (REFLEX)

## 2017-12-25 MED ORDER — ONDANSETRON 8 MG PO TBDP
8.0000 mg | ORAL_TABLET | Freq: Once | ORAL | Status: AC
  Administered 2017-12-25: 8 mg via ORAL
  Filled 2017-12-25: qty 1

## 2017-12-25 MED ORDER — IBUPROFEN 400 MG PO TABS
400.0000 mg | ORAL_TABLET | Freq: Once | ORAL | Status: AC
Start: 2017-12-25 — End: 2017-12-25
  Administered 2017-12-25: 400 mg via ORAL
  Filled 2017-12-25: qty 1

## 2017-12-25 MED ORDER — METRONIDAZOLE 500 MG PO TABS
2000.0000 mg | ORAL_TABLET | Freq: Once | ORAL | Status: AC
  Administered 2017-12-25: 2000 mg via ORAL
  Filled 2017-12-25: qty 4

## 2017-12-25 NOTE — ED Provider Notes (Signed)
[facility] EMERGENCY DEPARTMENT Provider Note   CSN: [id number] Arrival date & time: 12/25/17  [id number]     History   Chief Complaint Chief Complaint  Patient presents with  . Headache  . Otalgia  . STD check    HPI Christopher Sosa is a 27 y.o. male.  The history is provided by the patient.  Headache   This is a recurrent problem. The current episode started more than 1 week ago. The problem has been gradually improving. The headache is associated with bright light. The pain is moderate. Pertinent negatives include no fever, no shortness of breath and no vomiting. He has tried nothing for the symptoms.  Otalgia  This is a chronic problem. The current episode started more than 1 week ago. There is pain in both ears. The problem occurs constantly. The problem has not changed since onset.There has been no fever. Associated symptoms include headaches. Pertinent negatives include no abdominal pain and no vomiting.  Patient presents for multiple complaints  #1 headache-patient reports headache for 2 weeks almost daily.  Denies fever or vomiting.  No visual loss.  Reports mild blurred vision.  He reports he has local computer screen all day he feels this may be associated with it.  Denies fever/vomiting/weakness.  No tick bites or rashes  #2 patient reports ear pain for a while, requesting evaluation.  #3 Trichomonas evaluation-reports his girlfriend was undergoing STD testing for early pregnancy and was told she had trichomonas.  Patient reports he actually had full STD testing done [facility] the [facility] 2 weeks ago that was normal. He denies dysuria or penile discharge  Past Medical History:  Diagnosis Date  . Asthma    chilhood and teenage years, triggered by temperature and exercise.    Patient Active Problem List   Diagnosis Date Noted  . Chronic bilateral low back pain without sciatica 03/14/2016    History reviewed. No pertinent surgical  history.      Home Medications    Prior to Admission medications   Not on File    Family History Family History  Problem Relation Age of Onset  . Diabetes Mother   . Diabetes Father   . Diabetes Paternal Grandfather   . Hyperlipidemia Paternal Grandfather     Social History Social History   Tobacco Use  . Smoking status: Never Smoker  . Smokeless tobacco: Never Used  Substance Use Topics  . Alcohol use: No  . Drug use: No     Allergies   Penicillins   Review of Systems Review of Systems  Constitutional: Negative for fever.  HENT: Positive for ear pain.   Eyes:       Blurred vision, denies visual loss  Respiratory: Negative for shortness of breath.   Cardiovascular: Negative for chest pain.  Gastrointestinal: Negative for abdominal pain and vomiting.  Genitourinary: Negative for difficulty urinating, discharge, dysuria and penile pain.  Neurological: Positive for headaches. Negative for speech difficulty and weakness.  All other systems reviewed and are negative.    Physical Exam Updated Vital Signs BP (!) 155/89 (BP Location: Right Arm)   Pulse 76   Temp 98.1 F (36.7 C) (Oral)   Resp 16   Ht 1.778 m (5\' 10" )   Wt 77.1 kg   SpO2 100%   BMI 24.39 kg/m   Physical Exam  CONSTITUTIONAL: Well developed/well nourished HEAD: Normocephalic/atraumatic EYES: EOMI/PERRL, no nystagmus, no ptosis ENMT: Mucous membranes moist, bilateral TMs intact, no erythema noted NECK: supple  no meningeal signs, no bruits SPINE/BACK:entire spine nontender CV: S1/S2 noted, no murmurs/rubs/gallops noted LUNGS: Lungs are clear to auscultation bilaterally, no apparent distress ABDOMEN: soft, nontender, no rebound or guarding GU: Deferred NEURO:Awake/alert, face symmetric, no arm or leg drift is noted Equal 5/5 strength with shoulder abduction, elbow flex/extension, wrist flex/extension in upper extremities and equal hand grips bilaterally Equal 5/5 strength with hip  flexion,knee flex/extension, foot dorsi/plantar flexion Cranial nerves 3/4/5/6/11/19/08/11/12 tested and intact Gait normal without ataxia No past pointing Sensation to light touch intact in all extremities EXTREMITIES: pulses normal, full ROM SKIN: warm, color normal PSYCH: no abnormalities of mood noted, alert and oriented to situation   ED Treatments / Results  Labs (all labs ordered are listed, but only abnormal results are displayed) Labs Reviewed  URINALYSIS, ROUTINE W REFLEX MICROSCOPIC - Abnormal; Notable for the following components:      Result Value   Specific Gravity, Urine <1.005 (*)    Leukocytes, UA MODERATE (*)    All other components within normal limits  URINALYSIS, MICROSCOPIC (REFLEX) - Abnormal; Notable for the following components:   Bacteria, UA FEW (*)    All other components within normal limits  URINE CULTURE  GC/CHLAMYDIA PROBE AMP ([place]    EKG None  Radiology No results found.  Procedures Procedures  Medications Ordered in ED Medications  ibuprofen (ADVIL,MOTRIN) tablet 400 mg (400 mg Oral Given 12/25/17 0419)  ondansetron (ZOFRAN-ODT) disintegrating tablet 8 mg (8 mg Oral Given 12/25/17 0503)  metroNIDAZOLE (FLAGYL) tablet 2,000 mg (2,000 mg Oral Given 12/25/17 0502)     Initial Impression / Assessment and Plan / ED Course  I have reviewed the triage vital signs and the nursing notes.  Pertinent labs  results that were available during my care of the patient were reviewed by me and considered in my medical decision making (see chart for details).     For his headache, this is improving, refer to headache wellness center if this returns.  Referred to ENT for his ear pain  Patient requests one-time dose of Flagyl to treat trichomonas.  Urine culture has been sent, GC chlamydia tests are pending.  We discussed safe sex practices.  Advised no alcohol use while taking Flagyl  Final Clinical Impressions(s) / ED Diagnoses    Final diagnoses:  Other headache syndrome  Otalgia of both ears  Trichomonas exposure    ED Discharge Orders    None       Zadie RhineWickline, Jerrin, MD 12/25/17 [phone]

## 2017-12-25 NOTE — ED Triage Notes (Signed)
Pt is c/o migraine headache x 2 days  Denies N/V but states is sensitive to light  Pt is c/o left earache   Pt also would like to be checked for an STD states his girlfriend tested positive for trich

## 2017-12-25 NOTE — ED Notes (Signed)
Pt given cheese and crackers with his antibiotic

## 2017-12-25 NOTE — ED Notes (Signed)
ED Provider at bedside. 

## 2017-12-26 LAB — URINE CULTURE: Culture: NO GROWTH

## 2017-12-26 LAB — GC/CHLAMYDIA PROBE AMP (~~LOC~~) NOT AT ARMC
Chlamydia: NEGATIVE
Neisseria Gonorrhea: NEGATIVE

## 2017-12-31 ENCOUNTER — Ambulatory Visit: Payer: Self-pay | Admitting: Nurse Practitioner

## 2018-09-10 ENCOUNTER — Telehealth: Payer: Self-pay | Admitting: Nurse Practitioner

## 2018-09-10 NOTE — Telephone Encounter (Signed)
I called and left message on patient voicemail to call office per Charlotte Nche to schedule CPE.  °

## 2019-01-19 ENCOUNTER — Ambulatory Visit (INDEPENDENT_AMBULATORY_CARE_PROVIDER_SITE_OTHER)
Admission: RE | Admit: 2019-01-19 | Discharge: 2019-01-19 | Disposition: A | Discharge status: Home / Self Care | Payer: Managed Care, Other (non HMO) | Source: Ambulatory Visit

## 2019-01-19 DIAGNOSIS — H6992 Unspecified Eustachian tube disorder, left ear: Secondary | ICD-10-CM

## 2019-01-19 DIAGNOSIS — H6982 Other specified disorders of Eustachian tube, left ear: Secondary | ICD-10-CM

## 2019-01-19 MED ORDER — CETIRIZINE HCL 10 MG PO TABS: 10.0000 mg | ORAL_TABLET | Freq: Every day | ORAL | 0 refills | Status: DC

## 2019-01-19 MED ORDER — FLUTICASONE PROPIONATE 50 MCG/ACT NA SUSP: 1.0000 | Freq: Every day | NASAL | 2 refills | Status: DC

## 2019-01-19 NOTE — Discharge Instructions (Signed)
I believe this is eustachian tube dysfunction Take the Flonase and zyrtec as prescribed Follow up with ENT if problem persists.

## 2019-01-19 NOTE — ED Provider Notes (Signed)
Virtual Visit via Video Note:  NERI SAMEK  initiated request for Telemedicine visit with [facility] team. I connected with Christopher Ser Kotlarz  on 01/21/2019 at 8:27 AM  for a synchronized telemedicine visit using a video enabled HIPPA compliant telemedicine application. I verified that I am speaking with Christopher Ser Grieb  using two identifiers. Orvan July, NP  was physically located in a [facility]gent care site and CHEICK SUHR was located at a different location.   The limitations of evaluation and management by telemedicine as well as the availability of in-person appointments were discussed. Patient was informed that he  may incur a bill ( including co-pay) for this virtual visit encounter. Christopher Sosa  expressed understanding and gave verbal consent to proceed with virtual visit.     History of Present Illness:Christopher Sosa  is a 28 y.o. male presents with  Left ear fulness, discomfort, decreased hearing, tinnitus. This problem has been for over a month, waxing and waning. He has been flushing his ears. No fever, left, chest congestion, nasal congestion or rhinorrhea.   Past Medical History:  Diagnosis Date  . Asthma    chilhood and teenage years, triggered by temperature and exercise.    Allergies  Allergen Reactions  . Penicillins         Observations/Objective:VITALS: Per patient if applicable, see vitals. GENERAL: Alert, appears well and in no acute distress. HEENT: Atraumatic, conjunctiva clear, no obvious abnormalities on inspection of external nose and ears. NECK: Normal movements of the head and neck. CARDIOPULMONARY: No increased WOB. Speaking in clear sentences. I:E ratio WNL.  MS: Moves all visible extremities without noticeable abnormality. PSYCH: Pleasant and cooperative, well-groomed. Speech normal rate and rhythm. Affect is appropriate. Insight and judgement are appropriate. Attention is focused, linear, and appropriate.   NEURO: CN grossly intact. Oriented as arrived to appointment on time with no prompting. Moves both UE equally.  SKIN: No obvious lesions, wounds, erythema, or cyanosis noted on face or hands.     Assessment and Plan:symptoms consistent with eustachian tube dysfunction Treating with Zyrtec and Flonase.  Recommended if symptoms continue to follow-up with ear nose and throat specialist   Follow Up Instructions: Follow up as needed for continued or worsening symptoms     I discussed the assessment and treatment plan with the patient. The patient was provided an opportunity to ask questions and all were answered. The patient agreed with the plan and demonstrated an understanding of the instructions.   The patient was advised to call back or seek an in-person evaluation if the symptoms worsen or if the condition fails to improve as anticipated.      Orvan July, NP  01/21/2019 8:27 AM         Orvan July, NP 01/21/19 [phone]

## 2019-10-12 DIAGNOSIS — F411 Generalized anxiety disorder: Secondary | ICD-10-CM | POA: Diagnosis not present

## 2019-12-02 ENCOUNTER — Emergency Department (HOSPITAL_BASED_OUTPATIENT_CLINIC_OR_DEPARTMENT_OTHER)
Admission: EM | Admit: 2019-12-02 | Discharge: 2019-12-02 | Disposition: A | Discharge status: Home / Self Care | Payer: BC Managed Care – PPO | Attending: Emergency Medicine | Admitting: Emergency Medicine

## 2019-12-02 ENCOUNTER — Encounter (HOSPITAL_BASED_OUTPATIENT_CLINIC_OR_DEPARTMENT_OTHER): Payer: Self-pay

## 2019-12-02 ENCOUNTER — Other Ambulatory Visit: Payer: Self-pay

## 2019-12-02 DIAGNOSIS — M545 Low back pain: Secondary | ICD-10-CM | POA: Insufficient documentation

## 2019-12-02 DIAGNOSIS — J45909 Unspecified asthma, uncomplicated: Secondary | ICD-10-CM | POA: Insufficient documentation

## 2019-12-02 DIAGNOSIS — M5442 Lumbago with sciatica, left side: Secondary | ICD-10-CM | POA: Diagnosis not present

## 2019-12-02 DIAGNOSIS — M5440 Lumbago with sciatica, unspecified side: Secondary | ICD-10-CM

## 2019-12-02 MED ORDER — PREDNISONE 50 MG PO TABS
60.0000 mg | ORAL_TABLET | Freq: Once | ORAL | Status: AC
  Administered 2019-12-02: 60 mg via ORAL
  Filled 2019-12-02: qty 1

## 2019-12-02 MED ORDER — PREDNISONE 20 MG PO TABS: ORAL_TABLET | ORAL | 0 refills | Status: DC

## 2019-12-02 MED ORDER — ACETAMINOPHEN 500 MG PO TABS
1000.0000 mg | ORAL_TABLET | Freq: Once | ORAL | Status: AC
  Administered 2019-12-02: 1000 mg via ORAL
  Filled 2019-12-02: qty 2

## 2019-12-02 MED ORDER — METHOCARBAMOL 750 MG PO TABS: 750.0000 mg | ORAL_TABLET | Freq: Three times a day (TID) | ORAL | 0 refills | Status: DC | PRN

## 2019-12-02 NOTE — Discharge Instructions (Addendum)
It was our pleasure to provide your ER care today - we hope that you feel better.  Avoid bending at waist, or heavy lifting > 20 lbs for the next week.   May try gentle massage or heat therapy to sore area.   Take acetaminophen as need for pain. Take robaxin as need for muscle pain/spasm - no driving when taking. Take prednisone as prescribed.   Follow up with primary care doctor in 1-2 weeks if symptoms fail to improve/resolve - discuss possible back specialist referral if recurrent/persistent symptoms.   Return to ER if worse, new symptoms, fevers, severe or intractable pain, numbness/weakness, problems with bowel/bladder function, or other concern.

## 2019-12-02 NOTE — ED Triage Notes (Signed)
Pt c/o back pain that started yesterday. Intermittent radiation down back of L leg.

## 2019-12-02 NOTE — ED Notes (Signed)
Burning pain that went down leg

## 2019-12-02 NOTE — ED Provider Notes (Signed)
[facility] EMERGENCY DEPARTMENT Provider Note   CSN: [id number] Arrival date & time: 12/02/19  5465     History Chief Complaint  Patient presents with  . Back Pain    Christopher Sosa is a 29 y.o. male.  Patient c/o low back pain in the past 2-3 days. Symptoms acute onset, moderate, persistent, dull, occasionally radiating to left leg. Hx similar pain in past - states prior xrays and was told degenerative changes/?ddd. No specific injury recalled, although work does involve bending/lifting. Denies saddle area or leg numbness. No weakness. No urinary retention or incontinence. No problems w normal gi fxn. No anterior/abd pain. States tried ibuprofen with incomplete relief of symptoms. No fever or chills. States apart from back pain does not feel sick or ill.   The history is provided by the patient.  Back Pain Associated symptoms: no abdominal pain, no chest pain, no fever, no headaches, no numbness and no weakness        Past Medical History:  Diagnosis Date  . Asthma    chilhood and teenage years, triggered by temperature and exercise.    Patient Active Problem List   Diagnosis Date Noted  . Chronic bilateral low back pain without sciatica 03/14/2016    History reviewed. No pertinent surgical history.     Family History  Problem Relation Age of Onset  . Diabetes Mother   . Diabetes Father   . Diabetes Paternal Grandfather   . Hyperlipidemia Paternal Grandfather     Social History   Tobacco Use  . Smoking status: Never Smoker  . Smokeless tobacco: Never Used  Vaping Use  . Vaping Use: Never used  Substance Use Topics  . Alcohol use: No  . Drug use: No    Home Medications Prior to Admission medications   Medication Sig Start Date End Date Taking? Authorizing Provider  cetirizine (ZYRTEC) 10 MG tablet Take 1 tablet (10 mg total) by mouth daily. 01/19/19   Bast, Gloris Manchester A, NP  fluticasone (FLONASE) 50 MCG/ACT nasal spray Place 1 spray into both  nostrils daily. 01/19/19   Janace Aris, NP    Allergies    Penicillins  Review of Systems   Review of Systems  Constitutional: Negative for fever.  HENT: Negative for sore throat.   Eyes: Negative for redness.  Respiratory: Negative for shortness of breath.   Cardiovascular: Negative for chest pain.  Gastrointestinal: Negative for abdominal pain.  Genitourinary: Negative for difficulty urinating.  Musculoskeletal: Positive for back pain.  Skin: Negative for rash.  Neurological: Negative for weakness, numbness and headaches.  Hematological: Does not bruise/bleed easily.  Psychiatric/Behavioral: Negative for confusion.    Physical Exam Updated Vital Signs BP 118/79 (BP Location: Right Arm)   Pulse 78   Temp 97.9 F (36.6 C) (Oral)   Resp 20   Ht 1.778 m (5\' 10" )   Wt 83.9 kg   SpO2 100%   BMI 26.54 kg/m   Physical Exam Vitals and nursing note reviewed.  Constitutional:      Appearance: Normal appearance. He is well-developed.  HENT:     Head: Atraumatic.     Nose: Nose normal.     Mouth/Throat:     Mouth: Mucous membranes are moist.  Eyes:     General: No scleral icterus.    Conjunctiva/sclera: Conjunctivae normal.  Neck:     Trachea: No tracheal deviation.  Cardiovascular:     Rate and Rhythm: Normal rate.     Pulses: Normal pulses.  Pulmonary:     Effort: Pulmonary effort is normal. No accessory muscle usage or respiratory distress.  Abdominal:     General: There is no distension.     Palpations: Abdomen is soft.     Tenderness: There is no abdominal tenderness.  Genitourinary:    Comments: No cva tenderness. Musculoskeletal:        General: No swelling.     Cervical back: No tenderness.     Right lower leg: No edema.     Left lower leg: No edema.     Comments: CTLS spine, non tender, aligned, no step off. Lumbar muscular tenderness. No skin changes, lesions, erythema or sts noted.   Skin:    General: Skin is warm and dry.     Findings: No rash.   Neurological:     Mental Status: He is alert.     Comments: Alert, speech clear. Motor intact bil legs, stre 5/5. sens grossly intact. Reflexes 2+. +straight leg raise. Steady gait.   Psychiatric:        Mood and Affect: Mood normal.     ED Results / Procedures / Treatments   Labs (all labs ordered are listed, but only abnormal results are displayed) Labs Reviewed - No data to display  EKG None  Radiology No results found.  Procedures Procedures (including critical care time)  Medications Ordered in ED Medications - No data to display  ED Course  I have reviewed the triage vital signs and the nursing notes.  Pertinent labs & imaging results that were available during my care of the patient were reviewed by me and considered in my medical decision making (see chart for details).    MDM Rules/Calculators/A&P                          Reviewed nursing notes and prior charts for additional history. Prior xrays for low back pain reviewed, mild degenerative changes then, ddd.   Prednisone po. Acetaminophen po.   rx prednisone, robaxin for home.   Work note provided.  Return precautions provided.    Final Clinical Impression(s) / ED Diagnoses Final diagnoses:  None    Rx / DC Orders ED Discharge Orders    None       Cathren Laine, MD 12/02/19 [phone]

## 2019-12-29 ENCOUNTER — Ambulatory Visit: Payer: BC Managed Care – PPO | Attending: Internal Medicine

## 2019-12-29 DIAGNOSIS — Z23 Encounter for immunization: Secondary | ICD-10-CM

## 2019-12-29 NOTE — Progress Notes (Signed)
   Covid-19 Vaccination Clinic  Name:  Christopher Sosa    MRN: [id number] DOB: 1991/02/20  12/29/2019  Mr. Tep was observed post Covid-19 immunization for 30 minutes based on pre-vaccination screening without incident. He was provided with Vaccine Information Sheet and instruction to access the V-Safe system.   Mr. Heacock was instructed to call 911 with any severe reactions post vaccine: Marland Kitchen Difficulty breathing  . Swelling of face and throat  . A fast heartbeat  . A bad rash all over body  . Dizziness and weakness   Immunizations Administered    Name Date Dose VIS Date Route   Moderna COVID-19 Vaccine 12/29/2019 11:15 AM 0.5 mL 04/2019 Intramuscular   Manufacturer: Moderna   Lot: [id number]   NDC: 09470-962-83

## 2020-01-26 ENCOUNTER — Ambulatory Visit: Payer: BC Managed Care – PPO | Attending: Internal Medicine

## 2020-01-26 DIAGNOSIS — Z23 Encounter for immunization: Secondary | ICD-10-CM

## 2020-01-26 NOTE — Progress Notes (Signed)
   Covid-19 Vaccination Clinic  Name:  Christopher Sosa    MRN: [id number] DOB: [birth year]  01/26/2020  Mr. Christopher Sosa was observed post Covid-19 immunization for 15 minutes without incident. He was provided with Vaccine Information Sheet and instruction to access the V-Safe system.   Mr. Christopher Sosa was instructed to call 911 with any severe reactions post vaccine: Marland Kitchen Difficulty breathing  . Swelling of face and throat  . A fast heartbeat  . A bad rash all over body  . Dizziness and weakness   Immunizations Administered    Name Date Dose VIS Date Route   Moderna COVID-19 Vaccine 01/26/2020  9:10 AM 0.5 mL 04/2019 Intramuscular   Manufacturer: Moderna   Lot: [id number]   NDC: 71696-789-38

## 2020-02-12 ENCOUNTER — Telehealth: Payer: Self-pay | Admitting: General Practice

## 2020-02-12 NOTE — Telephone Encounter (Signed)
Lvm for patient to call back to schedule new patient appointment.  

## 2020-04-04 ENCOUNTER — Other Ambulatory Visit: Payer: Self-pay

## 2020-04-04 ENCOUNTER — Encounter: Payer: Self-pay | Admitting: Nurse Practitioner

## 2020-04-04 ENCOUNTER — Ambulatory Visit (INDEPENDENT_AMBULATORY_CARE_PROVIDER_SITE_OTHER): Payer: BC Managed Care – PPO | Admitting: Nurse Practitioner

## 2020-04-04 VITALS — BP 124/86 | HR 74 | Temp 97.6°F | Ht 68.0 in | Wt 192.6 lb

## 2020-04-04 DIAGNOSIS — M545 Low back pain, unspecified: Secondary | ICD-10-CM | POA: Diagnosis not present

## 2020-04-04 DIAGNOSIS — F329 Major depressive disorder, single episode, unspecified: Secondary | ICD-10-CM

## 2020-04-04 DIAGNOSIS — F5102 Adjustment insomnia: Secondary | ICD-10-CM

## 2020-04-04 DIAGNOSIS — G8929 Other chronic pain: Secondary | ICD-10-CM

## 2020-04-04 DIAGNOSIS — F32A Depression, unspecified: Secondary | ICD-10-CM | POA: Insufficient documentation

## 2020-04-04 MED ORDER — TRAZODONE HCL 50 MG PO TABS: 25.0000 mg | ORAL_TABLET | Freq: Every evening | ORAL | 3 refills | Status: DC | PRN

## 2020-04-04 MED ORDER — MELOXICAM 7.5 MG PO TABS: 7.5000 mg | ORAL_TABLET | Freq: Every day | ORAL | 2 refills | Status: DC

## 2020-04-04 NOTE — Assessment & Plan Note (Signed)
Chronic, waxing and waning No new symptoms. Minimal improvement with NSAIDs and tylenol  Start meloxicam Entered outpatient PT

## 2020-04-04 NOTE — Assessment & Plan Note (Signed)
Chronic, worsening Started counseling 22months ago with come improvement.

## 2020-04-04 NOTE — Progress Notes (Signed)
Subjective:  Patient ID: Christopher Sosa, male    DOB: 02-Mar-1991  Age: 29 y.o. MRN: [id number]  CC: Establish Care (New patient/Physical-Pt is not fasting. Declined flu vaccine )  HPI Counseling 1-2x/week  Chronic bilateral low back pain without sciatica Chronic, waxing and waning No new symptoms. 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 Has difficulty falling asleep  Start trazodone at hs F/up in 71month  Depression screen [facility] 2/9 04/04/2020 03/14/2016  Decreased Interest 1 0  Down, Depressed, Hopeless 2 0  PHQ - 2 Score 3 0  Altered sleeping 3 -  Tired, decreased energy 1 -  Change in appetite 0 -  Feeling bad or failure about yourself  1 -  Trouble concentrating 0 -  Moving slowly or fidgety/restless 3 -  Suicidal thoughts 0 -  PHQ-9 Score 11 -  Difficult doing work/chores Somewhat difficult -   GAD 7 : Generalized Anxiety Score 04/04/2020  Nervous, Anxious, on Edge 1  Control/stop worrying 0  Worry too much - different things 0  Trouble relaxing 3  Restless 3  Easily annoyed or irritable 1  Afraid - awful might happen 1  Total GAD 7 Score 9  Anxiety Difficulty Somewhat difficult   Reviewed past Medical, Social and Family history today.  Outpatient Medications Prior to Visit  Medication Sig Dispense Refill   cetirizine (ZYRTEC) 10 MG tablet Take 1 tablet (10 mg total) by mouth daily. 30 tablet 0   fluticasone (FLONASE) 50 MCG/ACT nasal spray Place 1 spray into both nostrils daily. 16 g 2   methocarbamol (ROBAXIN) 750 MG tablet Take 1 tablet (750 mg total) by mouth 3 (three) times daily as needed (muscle spasm/pain). 15 tablet 0   predniSONE (DELTASONE) 20 MG tablet 3 po once a day for 2 days, then 2 po once a day for 3 days, then 1 po once a day for 3 days 15 tablet 0   No  facility-administered medications prior to visit.    ROS See HPI  Objective:  BP 124/86 (BP Location: Left Arm, Patient Position: Sitting, Cuff Size: Large)    Pulse 74    Temp 97.6 F (36.4 C) (Temporal)    Ht 5\' 8"  (1.727 m)    Wt 192 lb 9.6 oz (87.4 kg)    SpO2 98%    BMI 29.28 kg/m   Physical Exam Cardiovascular:     Rate and Rhythm: Normal rate.     Pulses: Normal pulses.     Heart sounds: Normal heart sounds.  Pulmonary:     Effort: Pulmonary effort is normal.     Breath sounds: Normal breath sounds.  Abdominal:     Palpations: Abdomen is soft.  Musculoskeletal:        General: Tenderness present.     Lumbar back: Tenderness present. No bony tenderness. Normal range of motion. Negative right straight leg raise test and negative left straight leg raise test.  Skin:    General: Skin is warm and dry.     Findings: No rash.  Neurological:     Mental Status: He is oriented to person, place, and time.  Psychiatric:        Mood and Affect: Mood normal.        Behavior: Behavior normal.        Thought Content: Thought  content normal.    Assessment & Plan:  This visit occurred during the SARS-CoV-2 public health emergency.  Safety protocols were in place, including screening questions prior to the visit, additional usage of staff PPE, and extensive cleaning of exam room while observing appropriate contact time as indicated for disinfecting solutions.   [name] was seen today for establish care.  Diagnoses and all orders for this visit:  Chronic bilateral low back pain without sciatica -     Ambulatory referral to Physical Therapy -     meloxicam (MOBIC) 7.5 MG tablet; Take 1 tablet (7.5 mg total) by mouth daily. With food  Adjustment insomnia -     traZODone (DESYREL) 50 MG tablet; Take 0.5-1 tablets (25-50 mg total) by mouth at bedtime as needed for sleep.  Depressive disorder   Problem List Items Addressed This Visit      Other   Adjustment insomnia    Ongoing  for 66yrs, no improvement with melatonin 10mg  Works 3rd shift at to 7am x 73months Has difficulty falling asleep  Start trazodone at hs F/up in 64month      Relevant Medications   traZODone (DESYREL) 50 MG tablet   Chronic bilateral low back pain without sciatica - Primary    Chronic, waxing and waning No new symptoms. Minimal improvement with NSAIDs and tylenol  Start meloxicam Entered outpatient PT         Relevant Medications   meloxicam (MOBIC) 7.5 MG tablet   Other Relevant Orders   Ambulatory referral to Physical Therapy   Depressive disorder   Relevant Medications   traZODone (DESYREL) 50 MG tablet      Follow-up: Return for CPE (fasting) and anxiety and depression f/up.  2month, NP

## 2020-04-04 NOTE — Assessment & Plan Note (Signed)
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

## 2020-04-04 NOTE — Patient Instructions (Addendum)
You will be contacted to schedule appt for physical therapy. Use meloxicam daily for back pain, take with food. Use trazodone at bedtime. Continue counseling sessions.  Insomnia Insomnia is a sleep disorder that makes it difficult to fall asleep or stay asleep. Insomnia can cause fatigue, low energy, difficulty concentrating, mood swings, and poor performance at work or school. There are three different ways to classify insomnia:  Difficulty falling asleep.  Difficulty staying asleep.  Waking up too early in the morning. Any type of insomnia can be long-term (chronic) or short-term (acute). Both are common. Short-term insomnia usually lasts for three months or less. Chronic insomnia occurs at least three times a week for longer than three months. What are the causes? Insomnia may be caused by another condition, situation, or substance, such as:  Anxiety.  Certain medicines.  Gastroesophageal reflux disease (GERD) or other gastrointestinal conditions.  Asthma or other breathing conditions.  Restless legs syndrome, sleep apnea, or other sleep disorders.  Chronic pain.  Menopause.  Stroke.  Abuse of alcohol, tobacco, or illegal drugs.  Mental health conditions, such as depression.  Caffeine.  Neurological disorders, such as Alzheimer's disease.  An overactive thyroid (hyperthyroidism). Sometimes, the cause of insomnia may not be known. What increases the risk? Risk factors for insomnia include:  Gender. Women are affected more often than men.  Age. Insomnia is more common as you get older.  Stress.  Lack of exercise.  Irregular work schedule or working night shifts.  Traveling between different time zones.  Certain medical and mental health conditions. What are the signs or symptoms? If you have insomnia, the main symptom is having trouble falling asleep or having trouble staying asleep. This may lead to other symptoms, such as:  Feeling fatigued or having  low energy.  Feeling nervous about going to sleep.  Not feeling rested in the morning.  Having trouble concentrating.  Feeling irritable, anxious, or depressed. How is this diagnosed? This condition may be diagnosed based on:  Your symptoms and medical history. Your health care provider may ask about: ? Your sleep habits. ? Any medical conditions you have. ? Your mental health.  A physical exam. How is this treated? Treatment for insomnia depends on the cause. Treatment may focus on treating an underlying condition that is causing insomnia. Treatment may also include:  Medicines to help you sleep.  Counseling or therapy.  Lifestyle adjustments to help you sleep better. Follow these instructions at home: Eating and drinking   Limit or avoid alcohol, caffeinated beverages, and cigarettes, especially close to bedtime. These can disrupt your sleep.  Do not eat a large meal or eat spicy foods right before bedtime. This can lead to digestive discomfort that can make it hard for you to sleep. Sleep habits   Keep a sleep diary to help you and your health care provider figure out what could be causing your insomnia. Write down: ? When you sleep. ? When you wake up during the night. ? How well you sleep. ? How rested you feel the next day. ? Any side effects of medicines you are taking. ? What you eat and drink.  Make your bedroom a dark, comfortable place where it is easy to fall asleep. ? Put up shades or blackout curtains to block light from outside. ? Use a white noise machine to block noise. ? Keep the temperature cool.  Limit screen use before bedtime. This includes: ? Watching TV. ? Using your smartphone, tablet, or computer.  Stick  to a routine that includes going to bed and waking up at the same times every day and night. This can help you fall asleep faster. Consider making a quiet activity, such as reading, part of your nighttime routine.  Try to avoid taking  naps during the day so that you sleep better at night.  Get out of bed if you are still awake after 15 minutes of trying to sleep. Keep the lights down, but try reading or doing a quiet activity. When you feel sleepy, go back to bed. General instructions  Take over-the-counter and prescription medicines only as told by your health care provider.  Exercise regularly, as told by your health care provider. Avoid exercise starting several hours before bedtime.  Use relaxation techniques to manage stress. Ask your health care provider to suggest some techniques that may work well for you. These may include: ? Breathing exercises. ? Routines to release muscle tension. ? Visualizing peaceful scenes.  Make sure that you drive carefully. Avoid driving if you feel very sleepy.  Keep all follow-up visits as told by your health care provider. This is important. Contact a health care provider if:  You are tired throughout the day.  You have trouble in your daily routine due to sleepiness.  You continue to have sleep problems, or your sleep problems get worse. Get help right away if:  You have serious thoughts about hurting yourself or someone else. If you ever feel like you may hurt yourself or others, or have thoughts about taking your own life, get help right away. You can go to your nearest emergency department or call:  Your local emergency services (911 in the [place]  A suicide crisis helpline, such as the National Suicide Prevention Lifeline at (859)248-1878. This is open 24 hours a day. Summary  Insomnia is a sleep disorder that makes it difficult to fall asleep or stay asleep.  Insomnia can be long-term (chronic) or short-term (acute).  Treatment for insomnia depends on the cause. Treatment may focus on treating an underlying condition that is causing insomnia.  Keep a sleep diary to help you and your health care provider figure out what could be causing your insomnia. This  information is not intended to replace advice given to you by your health care provider. Make sure you discuss any questions you have with your health care provider. Document Revised: 04/12/2017 Document Reviewed: 02/07/2017 Elsevier Patient Education  2020 [employer].

## 2020-04-25 ENCOUNTER — Encounter: Payer: Self-pay | Admitting: Physical Therapy

## 2020-04-25 ENCOUNTER — Other Ambulatory Visit: Payer: Self-pay

## 2020-04-25 ENCOUNTER — Ambulatory Visit: Payer: BC Managed Care – PPO | Attending: Nurse Practitioner | Admitting: Physical Therapy

## 2020-04-25 DIAGNOSIS — G8929 Other chronic pain: Secondary | ICD-10-CM | POA: Insufficient documentation

## 2020-04-25 DIAGNOSIS — M5441 Lumbago with sciatica, right side: Secondary | ICD-10-CM | POA: Insufficient documentation

## 2020-04-25 DIAGNOSIS — R262 Difficulty in walking, not elsewhere classified: Secondary | ICD-10-CM | POA: Insufficient documentation

## 2020-04-25 DIAGNOSIS — R252 Cramp and spasm: Secondary | ICD-10-CM | POA: Insufficient documentation

## 2020-04-25 DIAGNOSIS — M5442 Lumbago with sciatica, left side: Secondary | ICD-10-CM | POA: Diagnosis not present

## 2020-04-25 NOTE — Patient Instructions (Signed)
Access Code: [email or web address]: [email or web address]: 12/13/2021Prepared by: Victorino Dike PaaExercises  Cat-Camel - 1 x daily - 7 x weekly - 2 sets - 10 reps - 30 hold  Quadruped Rocking Backward - 1 x daily - 7 x weekly - 2 sets - 10 reps - 30 hold  Cat to Child's Pose with Posterior Pelvic Tilt - 1 x daily - 7 x weekly - 2 sets - 10 reps - 30 hold  Supine Lower Trunk Rotation - 2 x daily - 7 x weekly - 1 sets - 10 reps - 10 hold  Seated Hamstring Stretch - 1 x daily - 7 x weekly - 2 sets - 10 reps - 30 hold  Supine Sciatic Nerve Mobilization With Leg on Pillow - 1 x daily - 7 x weekly - 2 sets - 10 reps - 30 hold
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
## 2020-05-04 ENCOUNTER — Other Ambulatory Visit: Payer: Self-pay

## 2020-05-04 ENCOUNTER — Ambulatory Visit: Payer: BC Managed Care – PPO | Admitting: Physical Therapy

## 2020-05-04 ENCOUNTER — Encounter: Payer: Self-pay | Admitting: Physical Therapy

## 2020-05-04 DIAGNOSIS — M5442 Lumbago with sciatica, left side: Secondary | ICD-10-CM | POA: Diagnosis not present

## 2020-05-04 DIAGNOSIS — R252 Cramp and spasm: Secondary | ICD-10-CM

## 2020-05-04 DIAGNOSIS — R262 Difficulty in walking, not elsewhere classified: Secondary | ICD-10-CM

## 2020-05-04 DIAGNOSIS — M5441 Lumbago with sciatica, right side: Secondary | ICD-10-CM | POA: Diagnosis not present

## 2020-05-04 DIAGNOSIS — G8929 Other chronic pain: Secondary | ICD-10-CM | POA: Diagnosis not present

## 2020-05-04 NOTE — Therapy (Signed)
[facility] [address] [place], [place], [address] Phone: [phone]   Fax:  [phone]  Physical Therapy Treatment  Patient Details  Name: Christopher Sosa MRN: [id number] Date of Birth: 1991-02-21 Referring Provider (PT): Alysia Penna, NP   Encounter Date: 05/04/2020   PT End of Session - 05/04/20 0808    Visit Number 2    Number of Visits 12    Date for PT Re-Evaluation 06/06/20    Authorization Type BCBS    PT Start Time 0802    PT Stop Time 0844    PT Time Calculation (min) 42 min           Past Medical History:  Diagnosis Date   Asthma    chilhood and teenage years, triggered by temperature and exercise.    History reviewed. No pertinent surgical history.  There were no vitals filed for this visit.   Subjective Assessment - 05/04/20 0807    Subjective Lower back is burning this morning. It is tolerable right now at 5/10. The exercises were okay. I just got off work. 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    Pertinent History depression, chronic pain    Limitations Sitting;Lifting;Standing;House hold activities    Diagnostic tests 2017 XR showed L4-L5 narrowing    Currently in Pain? Yes    Pain Score 5     Pain Location Back    Pain Orientation Lower    Pain Descriptors / Indicators Burning    Aggravating Factors  any activity    Pain Relieving Factors back masager, heating pad                             [facility] Adult PT Treatment/Exercise - 05/04/20 0001      Lumbar Exercises: Stretches   Active Hamstring Stretch 3 reps;Right;Left    Active Hamstring Stretch Limitations seated EOM    Single Knee to Chest Stretch 3 reps;30 seconds    Lower Trunk Rotation 10 seconds    Lower Trunk Rotation Limitations 10 reps    Pelvic Tilt 10 reps    Quadruped Mid Back Stretch Limitations childs pose , qped rocking, also seated childs pose with physioball      Lumbar Exercises: Aerobic    Nustep L5 UE/LE x 5 minutes      Lumbar Exercises: Supine   Bridge 10 reps    Bridge Limitations With PPT      Lumbar Exercises: Quadruped   Madcat/Old Horse 10 reps                       PT Long Term Goals - 04/25/20 1410      PT LONG TERM GOAL #1   Title Pt will be I with HEP for trunk, back and LE pain    Time 6    Period Weeks    Status New    Target Date 06/06/20      PT LONG TERM GOAL #2   Title Pt will be able to squat, lift without increased back pain    Time 6    Period Weeks    Status New    Target Date 06/06/20      PT LONG TERM GOAL #3   Title Pt will be able to improve ability to sleep due to less pain in back, LE, improved 20- 25%    Time 6  Period Weeks    Status New    Target Date 06/06/20      PT LONG TERM GOAL #4   Title Pt will be able to report only min occasional pain in Rt LE , centralized to back.    Time 6    Period Weeks    Status New    Target Date 06/06/20      PT LONG TERM GOAL #5   Title Pt will be able to increase AROM of trunk, back to [facility] and stretch only.    Time 6    Period Weeks    Status New    Target Date 06/06/20                 Plan - 05/04/20 0834    Clinical Impression Statement Pt reports the exercises are okay but are uncomfortable. Reviewed HEP and provided education on the benefit of improving his trunk flexibility to reduce pain with movement. He reports increased pain with hooklying and felt shifting in spine with release of left SKTC stretch. Also, increased left back pain with LTR especially with knees to the right. Increased pain with lumbar extension in qped and also limited ROM into childs pose. He was shown a seated option with rolling chair vs physioball. Began gentle core strength with PPT and bridge. Afterward he reported was a  little it looser.    PT Next Visit Plan HEP review, manual/modalities as needed.    PT Home Exercise Plan [phone]           Patient will benefit from  skilled therapeutic intervention in order to improve the following deficits and impairments:     Visit Diagnosis: Chronic bilateral low back pain with bilateral sciatica  Cramp and spasm  Difficulty in walking, not elsewhere classified     Problem List Patient Active Problem List   Diagnosis Date Noted   Adjustment insomnia 04/04/2020   Depressive disorder 04/04/2020   Chronic bilateral low back pain without sciatica 03/14/2016    Sherrie Mustache, PTA 05/04/2020, 8:45 AM  [facility] [address] [place], [place], [address] Phone: [phone]   Fax:  [phone]  Name: HORRACE HANAK MRN: [id number] Date of Birth: 1991/03/07

## 2020-05-09 ENCOUNTER — Ambulatory Visit: Payer: BC Managed Care – PPO | Admitting: Physical Therapy

## 2020-05-11 ENCOUNTER — Encounter: Payer: Self-pay | Admitting: Physical Therapy

## 2020-05-11 ENCOUNTER — Ambulatory Visit: Payer: BC Managed Care – PPO

## 2020-05-12 ENCOUNTER — Encounter: Payer: BC Managed Care – PPO | Admitting: Nurse Practitioner

## 2020-05-16 ENCOUNTER — Other Ambulatory Visit: Payer: Self-pay

## 2020-05-16 ENCOUNTER — Ambulatory Visit: Payer: BC Managed Care – PPO | Attending: Nurse Practitioner | Admitting: Physical Therapy

## 2020-05-16 ENCOUNTER — Encounter: Payer: Self-pay | Admitting: Physical Therapy

## 2020-05-16 DIAGNOSIS — M5442 Lumbago with sciatica, left side: Secondary | ICD-10-CM | POA: Diagnosis not present

## 2020-05-16 DIAGNOSIS — R252 Cramp and spasm: Secondary | ICD-10-CM | POA: Diagnosis not present

## 2020-05-16 DIAGNOSIS — M5441 Lumbago with sciatica, right side: Secondary | ICD-10-CM | POA: Diagnosis not present

## 2020-05-16 DIAGNOSIS — G8929 Other chronic pain: Secondary | ICD-10-CM | POA: Diagnosis not present

## 2020-05-16 DIAGNOSIS — R262 Difficulty in walking, not elsewhere classified: Secondary | ICD-10-CM | POA: Diagnosis not present

## 2020-05-16 NOTE — Therapy (Signed)
[facility] [address] [place], [place], [address] Phone: [phone]   Fax:  [phone]  Physical Therapy Treatment  Patient Details  Name: Christopher Sosa MRN: [id number] Date of Birth: 01-15-1991 Referring Provider (PT): Alysia Penna, NP   Encounter Date: 05/16/2020   PT End of Session - 05/16/20 0934    Visit Number 3    Number of Visits 12    Date for PT Re-Evaluation 06/06/20    Authorization Type BCBS    PT Start Time 0930    PT Stop Time 1012    PT Time Calculation (min) 42 min           Past Medical History:  Diagnosis Date  . Asthma    chilhood and teenage years, triggered by temperature and exercise.    History reviewed. No pertinent surgical history.  There were no vitals filed for this visit.   Subjective Assessment - 05/16/20 0932    Subjective Back is hurting a lot today. Pain is burning and sometimes spasms.    Currently in Pain? Yes    Pain Score 8     Pain Location Back    Pain Orientation Lower    Pain Descriptors / Indicators Burning;Spasm    Pain Type Chronic pain    Aggravating Factors  bending over, basic stuff, rotating and lifting    Pain Relieving Factors back massager , heating pad                             [facility] Adult PT Treatment/Exercise - 05/16/20 0001      Lumbar Exercises: Stretches   Active Hamstring Stretch 3 reps;Right;Left    Active Hamstring Stretch Limitations seated EOM    Single Knee to Chest Stretch 3 reps;30 seconds    Lower Trunk Rotation 10 seconds    Lower Trunk Rotation Limitations 10 reps    Quadruped Mid Back Stretch Limitations childs pose , qped rocking, also seated childs pose with physioball   also qped side bending     Lumbar Exercises: Aerobic   Nustep L5 UE/LE x 5 minutes      Lumbar Exercises: Standing   Other Standing Lumbar Exercises sit-stand x 10 focusing on hip hinge, squat taps x 10, deadlift x 10 then with 10# x  10 -all with cues for alignment      Lumbar Exercises: Supine   Bent Knee Raise 10 reps    Bent Knee Raise Limitations c/o popping at left side/low back    Bridge 10 reps      Lumbar Exercises: Quadruped   Madcat/Old Horse 10 reps                       PT Long Term Goals - 04/25/20 1410      PT LONG TERM GOAL #1   Title Pt will be I with HEP for trunk, back and LE pain    Time 6    Period Weeks    Status New    Target Date 06/06/20      PT LONG TERM GOAL #2   Title Pt will be able to squat, lift without increased back pain    Time 6    Period Weeks    Status New    Target Date 06/06/20      PT LONG TERM GOAL #3   Title Pt will be able to improve ability to  sleep due to less pain in back, LE, improved 20- 25%    Time 6    Period Weeks    Status New    Target Date 06/06/20      PT LONG TERM GOAL #4   Title Pt will be able to report only min occasional pain in Rt LE , centralized to back.    Time 6    Period Weeks    Status New    Target Date 06/06/20      PT LONG TERM GOAL #5   Title Pt will be able to increase AROM of trunk, back to [facility] and stretch only.    Time 6    Period Weeks    Status New    Target Date 06/06/20                 Plan - 05/16/20 0934    Clinical Impression Statement Pt reports he is compliant with HEP 5x per week. He reports pain was 3/10 yesterday but now 8/10 today for unknown reason. He is sleeping 4-5 hours per night and is not sure if he has had any RLE pain in the last week.  Began hip hinge/body mechanics education with pt able to demonstrate proper alignement with squat and deadlift. Able to complete 2 x 10 each without c/o pain. In supine, pt c/o left low back pain with supine marching and single knee to chest stretching. He describes the pain as a painful popping  during eccentric lowering of bent knee. Other exercises tolerated well.    PT Next Visit Plan HEP review, manual/modalities as needed.    PT Home  Exercise Plan [id number]           Patient will benefit from skilled therapeutic intervention in order to improve the following deficits and impairments:  Abnormal gait,Decreased mobility,Hypomobility,Increased muscle spasms,Pain,Postural dysfunction,Impaired UE functional use,Impaired flexibility,Increased fascial restricitons,Decreased strength,Decreased activity tolerance,Decreased range of motion,Improper body mechanics,Difficulty walking  Visit Diagnosis: Chronic bilateral low back pain with bilateral sciatica  Cramp and spasm  Difficulty in walking, not elsewhere classified     Problem List Patient Active Problem List   Diagnosis Date Noted  . Adjustment insomnia 04/04/2020  . Depressive disorder 04/04/2020  . Chronic bilateral low back pain without sciatica 03/14/2016    Sherrie Mustache, PTA 05/16/2020, 10:14 AM  [facility] [address] [place], [place], [address] Phone: [phone]   Fax:  [phone]  Name: Christopher Sosa MRN: [id number] Date of Birth: 01/15/1991

## 2020-05-18 ENCOUNTER — Other Ambulatory Visit: Payer: Self-pay

## 2020-05-18 ENCOUNTER — Ambulatory Visit: Payer: BC Managed Care – PPO | Admitting: Physical Therapy

## 2020-05-18 ENCOUNTER — Encounter: Payer: Self-pay | Admitting: Physical Therapy

## 2020-05-18 DIAGNOSIS — G8929 Other chronic pain: Secondary | ICD-10-CM | POA: Diagnosis not present

## 2020-05-18 DIAGNOSIS — M5441 Lumbago with sciatica, right side: Secondary | ICD-10-CM | POA: Diagnosis not present

## 2020-05-18 DIAGNOSIS — R252 Cramp and spasm: Secondary | ICD-10-CM | POA: Diagnosis not present

## 2020-05-18 DIAGNOSIS — R262 Difficulty in walking, not elsewhere classified: Secondary | ICD-10-CM

## 2020-05-18 DIAGNOSIS — M5442 Lumbago with sciatica, left side: Secondary | ICD-10-CM | POA: Diagnosis not present

## 2020-05-18 NOTE — Therapy (Addendum)
[place], [place], [address] Phone: [phone]   Fax:  [phone]  Physical Therapy Treatment/Discharge  Patient Details  Name: Christopher Sosa MRN: [id number] Date of Birth: 29-Mar-1991 Referring Provider (PT): Wilfred Lacy, NP   Encounter Date: 05/18/2020   PT End of Session - 05/18/20 0165    Visit Number 4    Number of Visits 12    Date for PT Re-Evaluation 06/06/20    Authorization Type BCBS    PT Start Time 0920    PT Stop Time 1003    PT Time Calculation (min) 43 min    Activity Tolerance Patient limited by pain    Behavior During Therapy [facility] for tasks assessed/performed           Past Medical History:  Diagnosis Date  . Asthma    chilhood and teenage years, triggered by temperature and exercise.    History reviewed. No pertinent surgical history.  There were no vitals filed for this visit.   Subjective Assessment - 05/18/20 0925    Subjective has been hurting since last visit.  Meds help sometimes, took some today. Sees MD    Currently in Pain? Yes    Pain Score 8     Pain Location Back    Pain Orientation Mid;Lower;Left;Right    Pain Descriptors / Indicators Tightness;Burning;Spasm    Pain Type Chronic pain    Pain Onset More than a month ago    Pain Frequency Constant                 [facility] Adult PT Treatment/Exercise - 05/18/20 0001      Self-Care   Self-Care Other Self-Care Comments    Other Self-Care Comments  Course of care:options if no progress.  Told him this is so chronic it may take longer to feel better, centralization of care, consistent stretching      Lumbar Exercises: Aerobic   Nustep L5 UE/LE x 5 minutes   encouraged core activation and increased work     Lumbar Exercises: Standing   Wall Slides Limitations small ROM  with foam roller in thoracic spine x 10 added arms overhead for    Other Standing Lumbar Exercises at wall, core stab exercises  using UEs : horizontal pull green band x 10 goal post x 10 and alternating UE flex/ext      Lumbar Exercises: Quadruped   Madcat/Old Horse 10 reps    Madcat/Old Horse Limitations focus on upper lumbar    Other Quadruped Lumbar Exercises childs pose and "thrread the needle" x 3 each side      Cryotherapy   Number Minutes Cryotherapy 10 Minutes    Cryotherapy Location Lumbar Spine    Type of Cryotherapy Ice pack concurrent with self care      Manual Therapy   Manual Therapy Soft tissue mobilization;Myofascial release    Manual therapy comments not well tolerated, hypersensitive along lumbar parapsinals    Soft tissue mobilization upper lumbar paraspinals    Myofascial Release back in prone                       PT Long Term Goals - 05/18/20 0952      PT LONG TERM GOAL #1   Title Pt will be I with HEP for trunk, back and LE pain    Status On-going      PT LONG TERM GOAL #2   Title Pt will be  able to squat, lift without increased back pain    Status On-going      PT LONG TERM GOAL #3   Title Pt will be able to improve ability to sleep due to less pain in back, LE, improved 20- 25%    Status On-going      PT LONG TERM GOAL #4   Title Pt will be able to report only min occasional pain in Rt LE , centralized to back.    Baseline still gets spasms in leg but pain is increased in back    Status On-going      PT LONG TERM GOAL #5   Title Pt will be able to increase AROM of trunk, back to [facility] and stretch only.    Status On-going                 Plan - 05/18/20 0954    Clinical Impression Statement Pt unable to report any improved function at this time BUT does have overall less pain in  his leg.  Tried to shift focus today to thoracic spine.  Ice for pain post session.  Limited tolerance for manual therapy due to pain and spasm.    PT Treatment/Interventions ADLs/Self Care Home Management;Cryotherapy;Traction;Gait training;Therapeutic exercise;Patient/family  education;Manual techniques;Electrical Stimulation;Moist Heat;Functional mobility training;Therapeutic activities;Dry needling;Passive range of motion    PT Next Visit Plan quadruped , trunk mobility ,modalities as needed-try IFC?    PT Home Exercise Plan [phone]    Consulted and Agree with Plan of Care Patient           Patient will benefit from skilled therapeutic intervention in order to improve the following deficits and impairments:  Abnormal gait,Decreased mobility,Hypomobility,Increased muscle spasms,Pain,Postural dysfunction,Impaired UE functional use,Impaired flexibility,Increased fascial restricitons,Decreased strength,Decreased activity tolerance,Decreased range of motion,Improper body mechanics,Difficulty walking  Visit Diagnosis: Chronic bilateral low back pain with bilateral sciatica  Cramp and spasm  Difficulty in walking, not elsewhere classified     Problem List Patient Active Problem List   Diagnosis Date Noted  . Adjustment insomnia 04/04/2020  . Depressive disorder 04/04/2020  . Chronic bilateral low back pain without sciatica 03/14/2016    Marylee Belzer 05/18/2020, 12:46 PM  [facility] [address] [place], [place], [address] Phone: [phone]   Fax:  [phone]  Name: Christopher Sosa MRN: [id number] Date of Birth: 17-May-1990  Raeford Razor, PT 05/18/20 12:46 PM Phone: [phone] Fax: [phone]   PHYSICAL THERAPY DISCHARGE SUMMARY  Visits from Start of Care: 4  Current functional level related to goals / functional outcomes:See above    Remaining deficits: See above for most recent    Education / Equipment:HEP, movement, body mechanics , symptom mgmt Plan: Patient agrees to discharge.  Patient goals were not met. Patient is being discharged due to not returning since the last visit.  ?????    Raeford Razor, PT 07/12/20 9:21 AM Phone: [phone] Fax: [phone]

## 2020-05-23 ENCOUNTER — Ambulatory Visit: Payer: BC Managed Care – PPO | Admitting: Physical Therapy

## 2020-05-23 ENCOUNTER — Other Ambulatory Visit: Payer: Self-pay

## 2020-05-23 ENCOUNTER — Encounter: Payer: Self-pay | Admitting: Physical Therapy

## 2020-05-23 DIAGNOSIS — M5442 Lumbago with sciatica, left side: Secondary | ICD-10-CM | POA: Diagnosis not present

## 2020-05-23 DIAGNOSIS — R262 Difficulty in walking, not elsewhere classified: Secondary | ICD-10-CM

## 2020-05-23 DIAGNOSIS — G8929 Other chronic pain: Secondary | ICD-10-CM

## 2020-05-23 DIAGNOSIS — R252 Cramp and spasm: Secondary | ICD-10-CM | POA: Diagnosis not present

## 2020-05-23 DIAGNOSIS — M5441 Lumbago with sciatica, right side: Secondary | ICD-10-CM | POA: Diagnosis not present

## 2020-05-23 NOTE — Therapy (Addendum)
[place], [place], [address] Phone: [phone]   Fax:  [phone]  Physical Therapy Treatment/Discharge  Patient Details  Name: Christopher Sosa MRN: [id number] Date of Birth: January 03, 1991 Referring Provider (PT): Wilfred Lacy, NP   Encounter Date: 05/23/2020   PT End of Session - 05/23/20 0935    Visit Number 5    Number of Visits 12    Date for PT Re-Evaluation 06/06/20    Authorization Type BCBS    PT Start Time 0930    PT Stop Time 1010    PT Time Calculation (min) 40 min           Past Medical History:  Diagnosis Date  . Asthma    chilhood and teenage years, triggered by temperature and exercise.    History reviewed. No pertinent surgical history.  There were no vitals filed for this visit.   Subjective Assessment - 05/23/20 0934    Subjective Pt reports feeling okay today, better than last time.    Currently in Pain? Yes    Pain Score 7     Pain Location Back    Pain Orientation Mid;Lower    Pain Descriptors / Indicators Tightness;Burning;Spasm    Aggravating Factors  bending over, lifting, rotating    Pain Relieving Factors ack massager heating pad                             [facility] Adult PT Treatment/Exercise - 05/23/20 0001      Lumbar Exercises: Stretches   Single Knee to Chest Stretch 3 reps;30 seconds    Lower Trunk Rotation 10 seconds    Lower Trunk Rotation Limitations 10 reps    Other Lumbar Stretch Exercise slant board stretch x 60 sec      Lumbar Exercises: Aerobic   Nustep L5 UE/LE x 5 minutes   encouraged core activation and increased work     Lumbar Exercises: Standing   Row 20 reps    Theraband Level (Row) Level 3 (Green)    Shoulder Extension 20 reps    Theraband Level (Shoulder Extension) Level 3 (Green)    Other Standing Lumbar Exercises palloff press double green x 10 each way, horizontal abduction x 20    Other Standing Lumbar  Exercises squats hold free motion bar x 20, sit-stand from mat without UE      Lumbar Exercises: Supine   Bridge 10 reps      Lumbar Exercises: Quadruped   Madcat/Old Horse 10 reps    Madcat/Old Horse Limitations focus on upper lumbar    Opposite Arm/Leg Raise 10 reps    Other Quadruped Lumbar Exercises childs pose and "thrread the needle" x 3 each side                       PT Long Term Goals - 05/18/20 0952      PT LONG TERM GOAL #1   Title Pt will be I with HEP for trunk, back and LE pain    Status On-going      PT LONG TERM GOAL #2   Title Pt will be able to squat, lift without increased back pain    Status On-going      PT LONG TERM GOAL #3   Title Pt will be able to improve ability to sleep due to less pain in back, LE, improved 20- 25%  Status On-going      PT LONG TERM GOAL #4   Title Pt will be able to report only min occasional pain in Rt LE , centralized to back.    Baseline still gets spasms in leg but pain is increased in back    Status On-going      PT LONG TERM GOAL #5   Title Pt will be able to increase AROM of trunk, back to [facility] and stretch only.    Status On-going                 Plan - 05/23/20 0936    Clinical Impression Statement Pt reports he is having a better day today. He rates his pain at 7/10. Continued with trunk and hip flexibilty and core/hip strength open and closed chain. Pt able to perform all therex and did not c/o pain.    PT Next Visit Plan quadruped , trunk mobility ,modalities as needed-try IFC?    PT Home Exercise Plan [phone]           Patient will benefit from skilled therapeutic intervention in order to improve the following deficits and impairments:  Abnormal gait,Decreased mobility,Hypomobility,Increased muscle spasms,Pain,Postural dysfunction,Impaired UE functional use,Impaired flexibility,Increased fascial restricitons,Decreased strength,Decreased activity tolerance,Decreased range of motion,Improper  body mechanics,Difficulty walking  Visit Diagnosis: Cramp and spasm  Chronic bilateral low back pain with bilateral sciatica  Difficulty in walking, not elsewhere classified     Problem List Patient Active Problem List   Diagnosis Date Noted  . Adjustment insomnia 04/04/2020  . Depressive disorder 04/04/2020  . Chronic bilateral low back pain without sciatica 03/14/2016    Dorene Ar , [place] 05/23/2020, 10:03 AM  [facility] [address] [place], [place], [address] Phone: [phone]   Fax:  [phone]  Name: Christopher Sosa MRN: [id number] Date of Birth: [birth year]  PHYSICAL THERAPY DISCHARGE SUMMARY  Visits from Start of Care: 5  Current functional level related to goals / functional outcomes: See above, continued pain    Remaining deficits: Pain, limited mobility, stiffness    Education / Equipment: HEP, fear avoidance and graded exposure, PNE  Plan: Patient agrees to discharge.  Patient goals were not met. Patient is being discharged due to not returning since the last visit.  ?????    Raeford Razor, PT 07/01/20 10:20 AM Phone: [phone] Fax: [phone]

## 2020-05-25 ENCOUNTER — Ambulatory Visit: Payer: BC Managed Care – PPO | Admitting: Physical Therapy

## 2020-05-25 ENCOUNTER — Telehealth: Payer: Self-pay | Admitting: Physical Therapy

## 2020-05-25 NOTE — Telephone Encounter (Signed)
Patient was called regarding his no show for PT this morning.  Mailbox for VM was full, unable to leave a message.  Karie Mainland, PT 05/25/20 9:45 AM Phone: [phone] Fax: [phone]

## 2020-05-30 ENCOUNTER — Ambulatory Visit: Payer: BC Managed Care – PPO | Admitting: Physical Therapy

## 2020-05-31 ENCOUNTER — Encounter: Payer: BC Managed Care – PPO | Admitting: Nurse Practitioner

## 2020-06-01 ENCOUNTER — Ambulatory Visit: Payer: BC Managed Care – PPO | Admitting: Physical Therapy

## 2020-06-07 ENCOUNTER — Ambulatory Visit: Payer: BC Managed Care – PPO | Admitting: Physical Therapy

## 2020-06-14 ENCOUNTER — Telehealth: Payer: BC Managed Care – PPO | Admitting: Emergency Medicine

## 2020-06-14 ENCOUNTER — Telehealth: Payer: BC Managed Care – PPO | Admitting: Physician Assistant

## 2020-06-14 DIAGNOSIS — J069 Acute upper respiratory infection, unspecified: Secondary | ICD-10-CM

## 2020-06-14 DIAGNOSIS — Z20822 Contact with and (suspected) exposure to covid-19: Secondary | ICD-10-CM

## 2020-06-14 MED ORDER — AZELASTINE HCL 0.1 % NA SOLN: 2.0000 | Freq: Two times a day (BID) | NASAL | 12 refills | Status: AC

## 2020-06-14 MED ORDER — BENZONATATE 100 MG PO CAPS: 100.0000 mg | ORAL_CAPSULE | Freq: Two times a day (BID) | ORAL | 0 refills | Status: DC | PRN

## 2020-06-14 MED ORDER — FLUTICASONE PROPIONATE 50 MCG/ACT NA SUSP: 2.0000 | Freq: Every day | NASAL | 0 refills | Status: AC

## 2020-06-14 NOTE — Progress Notes (Signed)

## 2020-06-14 NOTE — Addendum Note (Signed)
Addended by: Karrie Meres on: 06/14/2020 06:19 PM   Modules accepted: Orders

## 2020-06-14 NOTE — Progress Notes (Signed)
E-Visit for Corona Virus Screening  Your current symptoms could be consistent with the coronavirus. Even though you have been tested earlier this month, you indicated that your symptoms started after your test was completed so I suggest that you get retested for COVID.   In reviewing your chart, it appears that you already had a visit today for an upper respiratory infection. The medications that were provided for you during that visit are the same medications we usually use to treat the symptoms of COVID.   Many health care providers can now test patients at their office but not all are.  [facility] has multiple testing sites. For information on our COVID testing locations and hours go to [email or web address]  We are enrolling you in our MyChart Home Monitoring for COVID19 . Daily you will receive a questionnaire within the MyChart website. Our COVID 19 response team will be monitoring your responses daily.  Testing Information: The COVID-19 Community Testing sites are testing BY APPOINTMENT ONLY.  You can schedule online at [email or web address]  If you do not have access to a smart phone or computer you may call [phone] for an appointment.   Additional testing sites in the Community:  . For [facility] Testing sites in [facility]  FarmerBuys.com.au  . For Pop-up testing sites in [place]  [email or web address]  . For [facility]ternal[facility]it[facility].dk  . For [facility] testing in [place] and [employer] EternalVitamin.dk  . For [facility] testing in [facility]   [email or web address]  For  more information about community testing call  [phone]   Please quarantine yourself while awaiting your test results. Please stay home for a minimum of 10 days from the first day of illness with improving symptoms and you have had 24 hours of no fever (without the use of Tylenol (Acetaminophen) Motrin (Ibuprofen) or any fever reducing medication).  Also - Do not get tested prior to returning to work because once you have had a positive test the test can stay positive for more than a month in some cases.   You should wear a mask or cloth face covering over your nose and mouth if you must be around other people or animals, including pets (even at home). Try to stay at least 6 feet away from other people. This will protect the people around you.  Please continue good preventive care measures, including:  frequent hand-washing, avoid touching your face, cover coughs/sneezes, stay out of crowds and keep a 6 foot distance from others.  COVID-19 is a respiratory illness with symptoms that are similar to the flu. Symptoms are typically mild to moderate, but there have been cases of severe illness and death due to the virus.   The following symptoms may appear 2-14 days after exposure: . Fever . Cough . Shortness of breath or difficulty breathing . Chills . Repeated shaking with chills . Muscle pain . Headache . Sore throat . New loss of taste or smell . Fatigue . Congestion or runny nose . Nausea or vomiting . Diarrhea  Go to the nearest hospital ED for assessment if fever/cough/breathlessness are severe or illness seems like a threat to life.  It is vitally important that if you feel that you have an infection such as this virus or any other virus that you stay home and away from places where you may spread it to others.  You should avoid contact with people age 32 and older.   You may also take acetaminophen (Tylenol) as needed  for fever.  Reduce your risk of any infection by using the same precautions used for avoiding the common cold  or flu:  Marland Kitchen Wash your hands often with soap and warm water for at least 20 seconds.  If soap and water are not readily available, use an alcohol-based hand sanitizer with at least 60% alcohol.  . If coughing or sneezing, cover your mouth and nose by coughing or sneezing into the elbow areas of your shirt or coat, into a tissue or into your sleeve (not your hands). . Avoid shaking hands with others and consider head nods or verbal greetings only. . Avoid touching your eyes, nose, or mouth with unwashed hands.  . Avoid close contact with people who are sick. . Avoid places or events with large numbers of people in one location, like concerts or sporting events. . Carefully consider travel plans you have or are making. . If you are planning any travel outside or inside the [place], visit the CDC's Travelers' Health webpage for the latest health notices. . If you have some symptoms but not all symptoms, continue to monitor at home and seek medical attention if your symptoms worsen. . If you are having a medical emergency, call 911.  HOME CARE . Only take medications as instructed by your medical team. . Drink plenty of fluids and get plenty of rest. . A steam or ultrasonic humidifier can help if you have congestion.   GET HELP RIGHT AWAY IF YOU HAVE EMERGENCY WARNING SIGNS** FOR COVID-19. If you or someone is showing any of these signs seek emergency medical care immediately. Call 911 or proceed to your closest emergency facility if: . You develop worsening high fever. . Trouble breathing . Bluish lips or face . Persistent pain or pressure in the chest . New confusion . Inability to wake or stay awake . You cough up blood. . Your symptoms become more severe  **This list is not all possible symptoms. Contact your medical provider for any symptoms that are sever or concerning to you.  MAKE SURE YOU   Understand these instructions.  Will watch your condition.  Will get help right away if you  are not doing well or get worse.  Your e-visit answers were reviewed by a board certified advanced clinical practitioner to complete your personal care plan.  Depending on the condition, your plan could have included both over the counter or prescription medications.  If there is a problem please reply once you have received a response from your provider.  Your safety is important to [place].  If you have drug allergies check your prescription carefully.    You can use MyChart to ask questions about today's visit, request a non-urgent call back, or ask for a work or school excuse for 24 hours related to this e-Visit. If it has been greater than 24 hours you will need to follow up with your provider, or enter a new e-Visit to address those concerns. You will get an e-mail in the next two days asking about your experience.  I hope that your e-visit has been valuable and will speed your recovery. Thank you for using e-visits.   Approximately 5 minutes was spent documenting and reviewing patient's chart.

## 2020-06-29 ENCOUNTER — Ambulatory Visit (INDEPENDENT_AMBULATORY_CARE_PROVIDER_SITE_OTHER): Payer: BC Managed Care – PPO | Admitting: Nurse Practitioner

## 2020-06-29 ENCOUNTER — Encounter: Payer: Self-pay | Admitting: Nurse Practitioner

## 2020-06-29 ENCOUNTER — Other Ambulatory Visit: Payer: Self-pay

## 2020-06-29 VITALS — BP 124/82 | HR 77 | Temp 98.4°F | Ht 68.25 in | Wt 193.2 lb

## 2020-06-29 DIAGNOSIS — Z0001 Encounter for general adult medical examination with abnormal findings: Secondary | ICD-10-CM

## 2020-06-29 DIAGNOSIS — F5102 Adjustment insomnia: Secondary | ICD-10-CM | POA: Diagnosis not present

## 2020-06-29 DIAGNOSIS — G8929 Other chronic pain: Secondary | ICD-10-CM | POA: Diagnosis not present

## 2020-06-29 DIAGNOSIS — Z13220 Encounter for screening for lipoid disorders: Secondary | ICD-10-CM

## 2020-06-29 DIAGNOSIS — Z136 Encounter for screening for cardiovascular disorders: Secondary | ICD-10-CM

## 2020-06-29 DIAGNOSIS — M545 Low back pain, unspecified: Secondary | ICD-10-CM

## 2020-06-29 DIAGNOSIS — F329 Major depressive disorder, single episode, unspecified: Secondary | ICD-10-CM | POA: Diagnosis not present

## 2020-06-29 DIAGNOSIS — F32A Depression, unspecified: Secondary | ICD-10-CM

## 2020-06-29 MED ORDER — TEMAZEPAM 15 MG PO CAPS: 15.0000 mg | ORAL_CAPSULE | Freq: Every evening | ORAL | 1 refills | Status: AC | PRN

## 2020-06-29 MED ORDER — TIZANIDINE HCL 4 MG PO TABS: 4.0000 mg | ORAL_TABLET | Freq: Three times a day (TID) | ORAL | 1 refills | Status: AC | PRN

## 2020-06-29 MED ORDER — MELOXICAM 7.5 MG PO TABS: 7.5000 mg | ORAL_TABLET | Freq: Every day | ORAL | 1 refills | Status: DC

## 2020-06-29 NOTE — Patient Instructions (Signed)
Schedule fasting lab appt  You will be contacted to schedule appt with ortho-spine  Stop trazodone Use restoril for sleep  Continue meloxicam for back pain Do not take zanaflex within 2hrs of taking restoril due to risk of sedation.   Preventive Care 56-32 Years Old, Male Preventive care refers to lifestyle choices and visits with your health care provider that can promote health and wellness. This includes:  A yearly physical exam. This is also called an annual wellness visit.  Regular dental and eye exams.  Immunizations.  Screening for certain conditions.  Healthy lifestyle choices, such as: ? Eating a healthy diet. ? Getting regular exercise. ? Not using drugs or products that contain nicotine and tobacco. ? Limiting alcohol use. What can I expect for my preventive care visit? Physical exam Your health care provider may check your:  Height and weight. These may be used to calculate your BMI (body mass index). BMI is a measurement that tells if you are at a healthy weight.  Heart rate and blood pressure.  Body temperature.  Skin for abnormal spots. Counseling Your health care provider may ask you questions about your:  Past medical problems.  Family's medical history.  Alcohol, tobacco, and drug use.  Emotional well-being.  Home life and relationship well-being.  Sexual activity.  Diet, exercise, and sleep habits.  Work and work Astronomer.  Access to firearms. What immunizations do I need? Vaccines are usually given at various ages, according to a schedule. Your health care provider will recommend vaccines for you based on your age, medical history, and lifestyle or other factors, such as travel or where you work.   What tests do I need? Blood tests  Lipid and cholesterol levels. These may be checked every 5 years starting at age 38.  Hepatitis C test.  Hepatitis B test. Screening  Diabetes screening. This is done by checking your blood sugar  (glucose) after you have not eaten for a while (fasting).  Genital exam to check for testicular cancer or hernias.  STD (sexually transmitted disease) testing, if you are at risk. Talk with your health care provider about your test results, treatment options, and if necessary, the need for more tests.   Follow these instructions at home: Eating and drinking  Eat a healthy diet that includes fresh fruits and vegetables, whole grains, lean protein, and low-fat dairy products.  Drink enough fluid to keep your urine pale yellow.  Take vitamin and mineral supplements as recommended by your health care provider.  Do not drink alcohol if your health care provider tells you not to drink.  If you drink alcohol: ? Limit how much you have to 0-2 drinks a day. ? Be aware of how much alcohol is in your drink. In the [place] one drink equals one 12 oz bottle of beer (355 mL), one 5 oz glass of wine (148 mL), or one 1 oz glass of hard liquor (44 mL).   Lifestyle  Take daily care of your teeth and gums. Brush your teeth every morning and night with fluoride toothpaste. Floss one time each day.  Stay active. Exercise for at least 30 minutes 5 or more days each week.  Do not use any products that contain nicotine or tobacco, such as cigarettes, e-cigarettes, and chewing tobacco. If you need help quitting, ask your health care provider.  Do not use drugs.  If you are sexually active, practice safe sex. Use a condom or other form of protection to prevent STIs (sexually  transmitted infections).  Find healthy ways to cope with stress, such as: ? Meditation, yoga, or listening to music. ? Journaling. ? Talking to a trusted person. ? Spending time with friends and family. Safety  Always wear your seat belt while driving or riding in a vehicle.  Do not drive: ? If you have been drinking alcohol. Do not ride with someone who has been drinking. ? When you are tired or distracted. ? While  texting.  Wear a helmet and other protective equipment during sports activities.  If you have firearms in your house, make sure you follow all gun safety procedures.  Seek help if you have been physically or sexually abused. What's next?  Go to your health care provider once a year for an annual wellness visit.  Ask your health care provider how often you should have your eyes and teeth checked.  Stay up to date on all vaccines. This information is not intended to replace advice given to you by your health care provider. Make sure you discuss any questions you have with your health care provider. Document Revised: 01/14/2019 Document Reviewed: 04/24/2018 Elsevier Patient Education  2021 [employer].

## 2020-06-29 NOTE — Assessment & Plan Note (Signed)
Persistent back pain with left side radiculopathy. No weakness, no change in GI/GU function, no saddle paresthesia. Minimal improvement with outpatient PT, home exercise, use of oral prednisone dose pack and  flexeril in past.  Continue meloxicam Add zanaflex 4mg  prn Advised about risk of sedation: do not use within 2hrs of restoril dose. Do not take and drive. entered referral to ortho-spine

## 2020-06-29 NOTE — Progress Notes (Signed)
Subjective:    Patient ID: Christopher Sosa, male    DOB: [birth year], 30 y.o.   MRN: [id number]  Patient presents today for CPE and eval of chronic conditions  HPI Adjustment insomnia No improvement with trazodone 50-100mg . No caffeine or tobacco or marijuana or ETOH use.  D/c trazodone Start restoril 15mg  at hs prn  Depressive disorder Waxing and waning, worse in last 2weeks due to work stress. No SI/HI/hallucination Continues weekly sessions with therapist. Denies need for medication at this time F/up in 40month  Chronic bilateral low back pain without sciatica Persistent back pain with left side radiculopathy. No weakness, no change in GI/GU function, no saddle paresthesia. Minimal improvement with outpatient PT, home exercise, use of oral prednisone dose pack and  flexeril in past.  Continue meloxicam Add zanaflex 4mg  prn Advised about risk of sedation: do not use within 2hrs of restoril dose. Do not take and drive. entered referral to ortho-spine  Sexual History (orientation,birth control, marital status, STD):denies need for STD screen  Depression/Suicide: Depression screen [facility] 2/9 04/04/2020 03/14/2016  Decreased Interest 1 0  Down, Depressed, Hopeless 2 0  PHQ - 2 Score 3 0  Altered sleeping 3 -  Tired, decreased energy 1 -  Change in appetite 0 -  Feeling bad or failure about yourself  1 -  Trouble concentrating 0 -  Moving slowly or fidgety/restless 3 -  Suicidal thoughts 0 -  PHQ-9 Score 11 -  Difficult doing work/chores Somewhat difficult -   Vision:up to date, use of corrective lens  Dental:up to date  Immunizations: (TDAP, Hep C screen, Pneumovax, Influenza, zoster)  Health Maintenance  Topic Date Due  .  Hepatitis C: One time screening is recommended by Center for Disease Control  (CDC) for  adults born from [birth year] through 1967.   Never done  . HIV Screening  Never done  . Tetanus Vaccine  Never done  . Flu Shot  Never done  . COVID-19  Vaccine (3 - Booster for Moderna series) 07/25/2020   Diet:regular Exercise: none Weight:  Wt Readings from Last 3 Encounters:  06/29/20 193 lb 3.2 oz (87.6 kg)  04/04/20 192 lb 9.6 oz (87.4 kg)  12/02/19 185 lb (83.9 kg)   Fall Risk: Fall Risk  06/29/2020 04/04/2020 03/14/2016  Falls in the past year? 0 0 No  Number falls in past yr: 0 0 -  Injury with Fall? 0 0 -  Risk for fall due to : No Fall Risks - -  Follow up Falls evaluation completed - -   Medications and allergies reviewed with patient and updated if appropriate.  Patient Active Problem List   Diagnosis Date Noted  . Adjustment insomnia 04/04/2020  . Depressive disorder 04/04/2020  . Chronic bilateral low back pain without sciatica 03/14/2016    Current Outpatient Medications on File Prior to Visit  Medication Sig Dispense Refill  . azelastine (ASTELIN) 0.1 % nasal spray Place 2 sprays into both nostrils 2 (two) times daily. Use in each nostril as directed 30 mL 12  . fluticasone (FLONASE) 50 MCG/ACT nasal spray Place 2 sprays into both nostrils daily. 9.9 mL 0   No current facility-administered medications on file prior to visit.    Past Medical History:  Diagnosis Date  . Asthma    chilhood and teenage years, triggered by temperature and exercise.    History reviewed. No pertinent surgical history.  Social History   Socioeconomic History  . Marital status: Married  Spouse name: Not on file  . Number of children: Not on file  . Years of education: Not on file  . Highest education level: Not on file  Occupational History  . Not on file  Tobacco Use  . Smoking status: Never Smoker  . Smokeless tobacco: Never Used  Vaping Use  . Vaping Use: Never used  Substance and Sexual Activity  . Alcohol use: No  . Drug use: No  . Sexual activity: Yes    Birth control/protection: None  Other Topics Concern  . Not on file  Social History Narrative  . Not on file   Social Determinants of Health    Financial Resource Strain: Not on file  Food Insecurity: Not on file  Transportation Needs: Not on file  Physical Activity: Not on file  Stress: Not on file  Social Connections: Not on file   Family History  Problem Relation Age of Onset  . Diabetes Mother   . Diabetes Father   . Diabetes Paternal Grandfather   . Hyperlipidemia Paternal Grandfather        Review of Systems  Constitutional: Negative for fever, malaise/fatigue and weight loss.  HENT: Negative for congestion and sore throat.   Eyes:       Negative for visual changes  Respiratory: Negative for cough and shortness of breath.   Cardiovascular: Negative for chest pain, palpitations and leg swelling.  Gastrointestinal: Negative for blood in stool, constipation, diarrhea and heartburn.  Genitourinary: Negative for dysuria, frequency and urgency.  Musculoskeletal: Positive for back pain. Negative for falls, joint pain and myalgias.  Skin: Negative for rash.  Neurological: Negative for dizziness, sensory change and headaches.  Endo/Heme/Allergies: Does not bruise/bleed easily.  Psychiatric/Behavioral: Positive for depression. Negative for hallucinations, substance abuse and suicidal ideas. The patient has insomnia. The patient is not nervous/anxious.     Objective:   Vitals:   06/29/20 1133  BP: 124/82  Pulse: 77  Temp: 98.4 F (36.9 C)  SpO2: 99%   Body mass index is 29.16 kg/m.  Physical Examination:  Physical Exam Vitals reviewed.  Constitutional:      General: He is not in acute distress.    Appearance: He is well-developed.  HENT:     Right Ear: Tympanic membrane, ear canal and external ear normal.     Left Ear: Tympanic membrane, ear canal and external ear normal.     Mouth/Throat:     Mouth: Oropharynx is clear and moist.  Eyes:     Extraocular Movements: Extraocular movements intact and EOM normal.     Conjunctiva/sclera: Conjunctivae normal.  Cardiovascular:     Rate and Rhythm: Normal  rate and regular rhythm.     Pulses: Normal pulses.     Heart sounds: Normal heart sounds.  Pulmonary:     Effort: Pulmonary effort is normal. No respiratory distress.     Breath sounds: Normal breath sounds.  Chest:     Chest wall: No tenderness.  Abdominal:     General: Bowel sounds are normal.     Palpations: Abdomen is soft.  Musculoskeletal:        General: No edema. Normal range of motion.     Cervical back: Normal range of motion and neck supple.     Right lower leg: No edema.     Left lower leg: No edema.  Lymphadenopathy:     Cervical: No cervical adenopathy.  Skin:    General: Skin is warm and dry.     Findings:  No erythema or rash.  Neurological:     Mental Status: He is alert and oriented to person, place, and time.     Deep Tendon Reflexes: Reflexes are normal and symmetric.  Psychiatric:        Mood and Affect: Mood normal.        Behavior: Behavior normal.        Thought Content: Thought content normal.    ASSESSMENT and PLAN: This visit occurred during the SARS-CoV-2 public health emergency.  Safety protocols were in place, including screening questions prior to the visit, additional usage of staff PPE, and extensive cleaning of exam room while observing appropriate contact time as indicated for disinfecting solutions.   Jodie was seen today for annual exam.  Diagnoses and all orders for this visit:  Encounter for preventative adult health care exam with abnormal findings -     Comprehensive metabolic panel; Future -     CBC with Differential/Platelet; Future -     Lipid panel; Future  Depressive disorder  Encounter for lipid screening for cardiovascular disease -     Lipid panel; Future  Adjustment insomnia -     temazepam (RESTORIL) 15 MG capsule; Take 1 capsule (15 mg total) by mouth at bedtime as needed for sleep.  Chronic bilateral low back pain without sciatica -     AMB referral to orthopedics -     meloxicam (MOBIC) 7.5 MG tablet; Take 1  tablet (7.5 mg total) by mouth daily. With food -     tiZANidine (ZANAFLEX) 4 MG tablet; Take 1 tablet (4 mg total) by mouth every 8 (eight) hours as needed for muscle spasms.       Problem List Items Addressed This Visit      Other   Adjustment insomnia    No improvement with trazodone 50-100mg . No caffeine or tobacco or marijuana or ETOH use.  D/c trazodone Start restoril 15mg  at hs prn      Relevant Medications   temazepam (RESTORIL) 15 MG capsule   Chronic bilateral low back pain without sciatica    Persistent back pain with left side radiculopathy. No weakness, no change in GI/GU function, no saddle paresthesia. Minimal improvement with outpatient PT, home exercise, use of oral prednisone dose pack and  flexeril in past.  Continue meloxicam Add zanaflex 4mg  prn Advised about risk of sedation: do not use within 2hrs of restoril dose. Do not take and drive. entered referral to ortho-spine      Relevant Medications   meloxicam (MOBIC) 7.5 MG tablet   tiZANidine (ZANAFLEX) 4 MG tablet   Other Relevant Orders   AMB referral to orthopedics   Depressive disorder    Waxing and waning, worse in last 2weeks due to work stress. No SI/HI/hallucination Continues weekly sessions with therapist. Denies need for medication at this time F/up in 35month       Other Visit Diagnoses    Encounter for preventative adult health care exam with abnormal findings    -  Primary   Relevant Orders   Comprehensive metabolic panel   CBC with Differential/Platelet   Lipid panel   Encounter for lipid screening for cardiovascular disease       Relevant Orders   Lipid panel      Follow up: Return in about 4 weeks (around 07/27/2020) for insomnia and depression f/up (video appt).  2month, NP

## 2020-06-29 NOTE — Assessment & Plan Note (Signed)
No improvement with trazodone 50-100mg . No caffeine or tobacco or marijuana or ETOH use.  D/c trazodone Start restoril 15mg  at hs prn

## 2020-06-29 NOTE — Assessment & Plan Note (Signed)
Waxing and waning, worse in last 2weeks due to work stress. No SI/HI/hallucination Continues weekly sessions with therapist. Denies need for medication at this time F/up in 28month

## 2020-07-07 ENCOUNTER — Other Ambulatory Visit: Payer: Self-pay | Admitting: Nurse Practitioner

## 2020-07-07 DIAGNOSIS — G8929 Other chronic pain: Secondary | ICD-10-CM

## 2020-07-07 NOTE — Telephone Encounter (Signed)
Last OV 06/29/20 Last fill 06/29/20 #90/1

## 2020-07-11 ENCOUNTER — Other Ambulatory Visit: Payer: Self-pay

## 2020-07-12 ENCOUNTER — Other Ambulatory Visit (INDEPENDENT_AMBULATORY_CARE_PROVIDER_SITE_OTHER): Payer: BC Managed Care – PPO

## 2020-07-12 DIAGNOSIS — Z0001 Encounter for general adult medical examination with abnormal findings: Secondary | ICD-10-CM | POA: Diagnosis not present

## 2020-07-12 DIAGNOSIS — Z13220 Encounter for screening for lipoid disorders: Secondary | ICD-10-CM | POA: Diagnosis not present

## 2020-07-12 DIAGNOSIS — Z136 Encounter for screening for cardiovascular disorders: Secondary | ICD-10-CM

## 2020-07-12 LAB — LIPID PANEL
Cholesterol: 138 mg/dL (ref 0–200)
HDL: 55.5 mg/dL (ref >39.00)
LDL Cholesterol: 75 mg/dL (ref 0–99)
NonHDL: 82.3
Total CHOL/HDL Ratio: 2
Triglycerides: 37 mg/dL (ref 0.0–149.0)
VLDL: 7.4 mg/dL (ref 0.0–40.0)

## 2020-07-12 LAB — COMPREHENSIVE METABOLIC PANEL
ALT: 18 U/L (ref 0–53)
AST: 16 U/L (ref 0–37)
Albumin: 4.4 g/dL (ref 3.5–5.2)
Alkaline Phosphatase: 50 U/L (ref 39–117)
BUN: 15 mg/dL (ref 6–23)
CO2: 29 mEq/L (ref 19–32)
Calcium: 9.7 mg/dL (ref 8.4–10.5)
Chloride: 103 mEq/L (ref 96–112)
Creatinine, Ser: 0.99 mg/dL (ref 0.40–1.50)
GFR: 101.74 mL/min (ref >60.00)
Glucose, Bld: 74 mg/dL (ref 70–99)
Potassium: 3.9 mEq/L (ref 3.5–5.1)
Sodium: 140 mEq/L (ref 135–145)
Total Bilirubin: 0.5 mg/dL (ref 0.2–1.2)
Total Protein: 7.5 g/dL (ref 6.0–8.3)

## 2020-07-12 LAB — CBC WITH DIFFERENTIAL/PLATELET
Basophils Absolute: 0 10*3/uL (ref 0.0–0.1)
Basophils Relative: 0.6 % (ref 0.0–3.0)
Eosinophils Absolute: 0.3 10*3/uL (ref 0.0–0.7)
Eosinophils Relative: 4.8 % (ref 0.0–5.0)
HCT: 40.1 % (ref 39.0–52.0)
Hemoglobin: 13.5 g/dL (ref 13.0–17.0)
Lymphocytes Relative: 37.4 % (ref 12.0–46.0)
Lymphs Abs: 2.7 10*3/uL (ref 0.7–4.0)
MCHC: 33.6 g/dL (ref 30.0–36.0)
MCV: 85.9 fl (ref 78.0–100.0)
Monocytes Absolute: 0.4 10*3/uL (ref 0.1–1.0)
Monocytes Relative: 6.2 % (ref 3.0–12.0)
Neutro Abs: 3.7 10*3/uL (ref 1.4–7.7)
Neutrophils Relative %: 51 % (ref 43.0–77.0)
Platelets: 320 10*3/uL (ref 150.0–400.0)
RBC: 4.67 Mil/uL (ref 4.22–5.81)
RDW: 14.2 % (ref 11.5–15.5)
WBC: 7.3 10*3/uL (ref 4.0–10.5)

## 2020-07-12 NOTE — Progress Notes (Signed)
Per orders of NP charlotte Nche pt is here for labs only, pt tolerated lab draw well.

## 2020-07-14 ENCOUNTER — Other Ambulatory Visit: Payer: Self-pay | Admitting: Nurse Practitioner

## 2020-07-14 DIAGNOSIS — G8929 Other chronic pain: Secondary | ICD-10-CM

## 2020-07-14 DIAGNOSIS — M545 Low back pain, unspecified: Secondary | ICD-10-CM

## 2020-07-18 ENCOUNTER — Ambulatory Visit: Payer: BC Managed Care – PPO | Admitting: Family Medicine

## 2020-08-04 ENCOUNTER — Telehealth: Payer: BC Managed Care – PPO | Admitting: Nurse Practitioner

## 2020-10-29 ENCOUNTER — Other Ambulatory Visit: Payer: Self-pay | Admitting: Nurse Practitioner

## 2020-10-29 DIAGNOSIS — F5102 Adjustment insomnia: Secondary | ICD-10-CM

## 2020-12-07 ENCOUNTER — Other Ambulatory Visit: Payer: Self-pay | Admitting: Nurse Practitioner

## 2020-12-07 DIAGNOSIS — F5102 Adjustment insomnia: Secondary | ICD-10-CM

## 2020-12-21 ENCOUNTER — Encounter: Payer: Self-pay | Admitting: Nurse Practitioner

## 2020-12-30 ENCOUNTER — Telehealth: Payer: Self-pay | Admitting: Nurse Practitioner

## 2020-12-30 NOTE — Telephone Encounter (Signed)
Sent no show letter to pt, it was returned with new address noted:  [address] Christopher Sosa [place], [place] [address] Sent new letter

## 2022-06-29 ENCOUNTER — Telehealth: Payer: Self-pay | Admitting: Nurse Practitioner

## 2022-06-29 NOTE — Telephone Encounter (Signed)
Lvm to see if patient still wants to see Baldo Ash as PCP or if we need to update profile

## 2023-10-22 ENCOUNTER — Encounter: Payer: Self-pay | Admitting: Nurse Practitioner
